# Patient Record
Sex: Male | Born: 1935 | Race: White | Hispanic: No | Marital: Single | Smoking: Former smoker
Health system: Southern US, Community
[De-identification: ages and names within clinical notes are randomized; demographics above are authoritative.]

## PROBLEM LIST (undated history)

## (undated) DIAGNOSIS — I639 Cerebral infarction, unspecified: Secondary | ICD-10-CM

## (undated) DIAGNOSIS — R600 Localized edema: Secondary | ICD-10-CM

## (undated) DIAGNOSIS — F0390 Unspecified dementia without behavioral disturbance: Secondary | ICD-10-CM

## (undated) DIAGNOSIS — R2689 Other abnormalities of gait and mobility: Secondary | ICD-10-CM

## (undated) DIAGNOSIS — I341 Nonrheumatic mitral (valve) prolapse: Secondary | ICD-10-CM

## (undated) DIAGNOSIS — G3183 Dementia with Lewy bodies: Secondary | ICD-10-CM

## (undated) DIAGNOSIS — I6789 Other cerebrovascular disease: Secondary | ICD-10-CM

## (undated) DIAGNOSIS — C4492 Squamous cell carcinoma of skin, unspecified: Secondary | ICD-10-CM

## (undated) DIAGNOSIS — IMO0001 Reserved for inherently not codable concepts without codable children: Secondary | ICD-10-CM

## (undated) DIAGNOSIS — Z7901 Long term (current) use of anticoagulants: Secondary | ICD-10-CM

## (undated) DIAGNOSIS — F419 Anxiety disorder, unspecified: Secondary | ICD-10-CM

## (undated) DIAGNOSIS — F32A Depression, unspecified: Secondary | ICD-10-CM

## (undated) DIAGNOSIS — C4491 Basal cell carcinoma of skin, unspecified: Secondary | ICD-10-CM

## (undated) DIAGNOSIS — I444 Left anterior fascicular block: Secondary | ICD-10-CM

## (undated) DIAGNOSIS — R0600 Dyspnea, unspecified: Secondary | ICD-10-CM

## (undated) DIAGNOSIS — M1990 Unspecified osteoarthritis, unspecified site: Secondary | ICD-10-CM

## (undated) DIAGNOSIS — I77810 Thoracic aortic ectasia: Secondary | ICD-10-CM

## (undated) DIAGNOSIS — Z531 Procedure and treatment not carried out because of patient's decision for reasons of belief and group pressure: Secondary | ICD-10-CM

## (undated) DIAGNOSIS — I7300 Raynaud's syndrome without gangrene: Secondary | ICD-10-CM

## (undated) DIAGNOSIS — D649 Anemia, unspecified: Secondary | ICD-10-CM

## (undated) DIAGNOSIS — F0150 Vascular dementia without behavioral disturbance: Secondary | ICD-10-CM

## (undated) DIAGNOSIS — Z789 Other specified health status: Secondary | ICD-10-CM

## (undated) DIAGNOSIS — N400 Enlarged prostate without lower urinary tract symptoms: Secondary | ICD-10-CM

## (undated) DIAGNOSIS — K4090 Unilateral inguinal hernia, without obstruction or gangrene, not specified as recurrent: Secondary | ICD-10-CM

## (undated) HISTORY — DX: Cerebral infarction, unspecified: I63.9

## (undated) HISTORY — DX: Basal cell carcinoma of skin, unspecified: C44.91

## (undated) HISTORY — PX: COLONOSCOPY: SHX174

## (undated) HISTORY — PX: TONSILLECTOMY: SUR1361

---

## 2008-06-14 ENCOUNTER — Ambulatory Visit: Payer: Self-pay | Admitting: Internal Medicine

## 2009-12-29 IMAGING — CR DG CHEST 2V
1 series · 2 of 2 positions shown · non-contrast
Comparison: none

REASON FOR EXAM: bronchitis
COMMENTS:

PROCEDURE:     DXR - DXR CHEST PA (OR AP) AND LATERAL  - June 14, 2008  [DATE]
RESULT:      The lungs are clear of acute infiltrate. Pleural thickening is
noted in the left lung bases and is most consistent with scarring. The
cardiovascular structures are unremarkable.

[Series 1: view not recorded · 0.17mm/px · 2 of 2 slices shown]
[im 1/2]
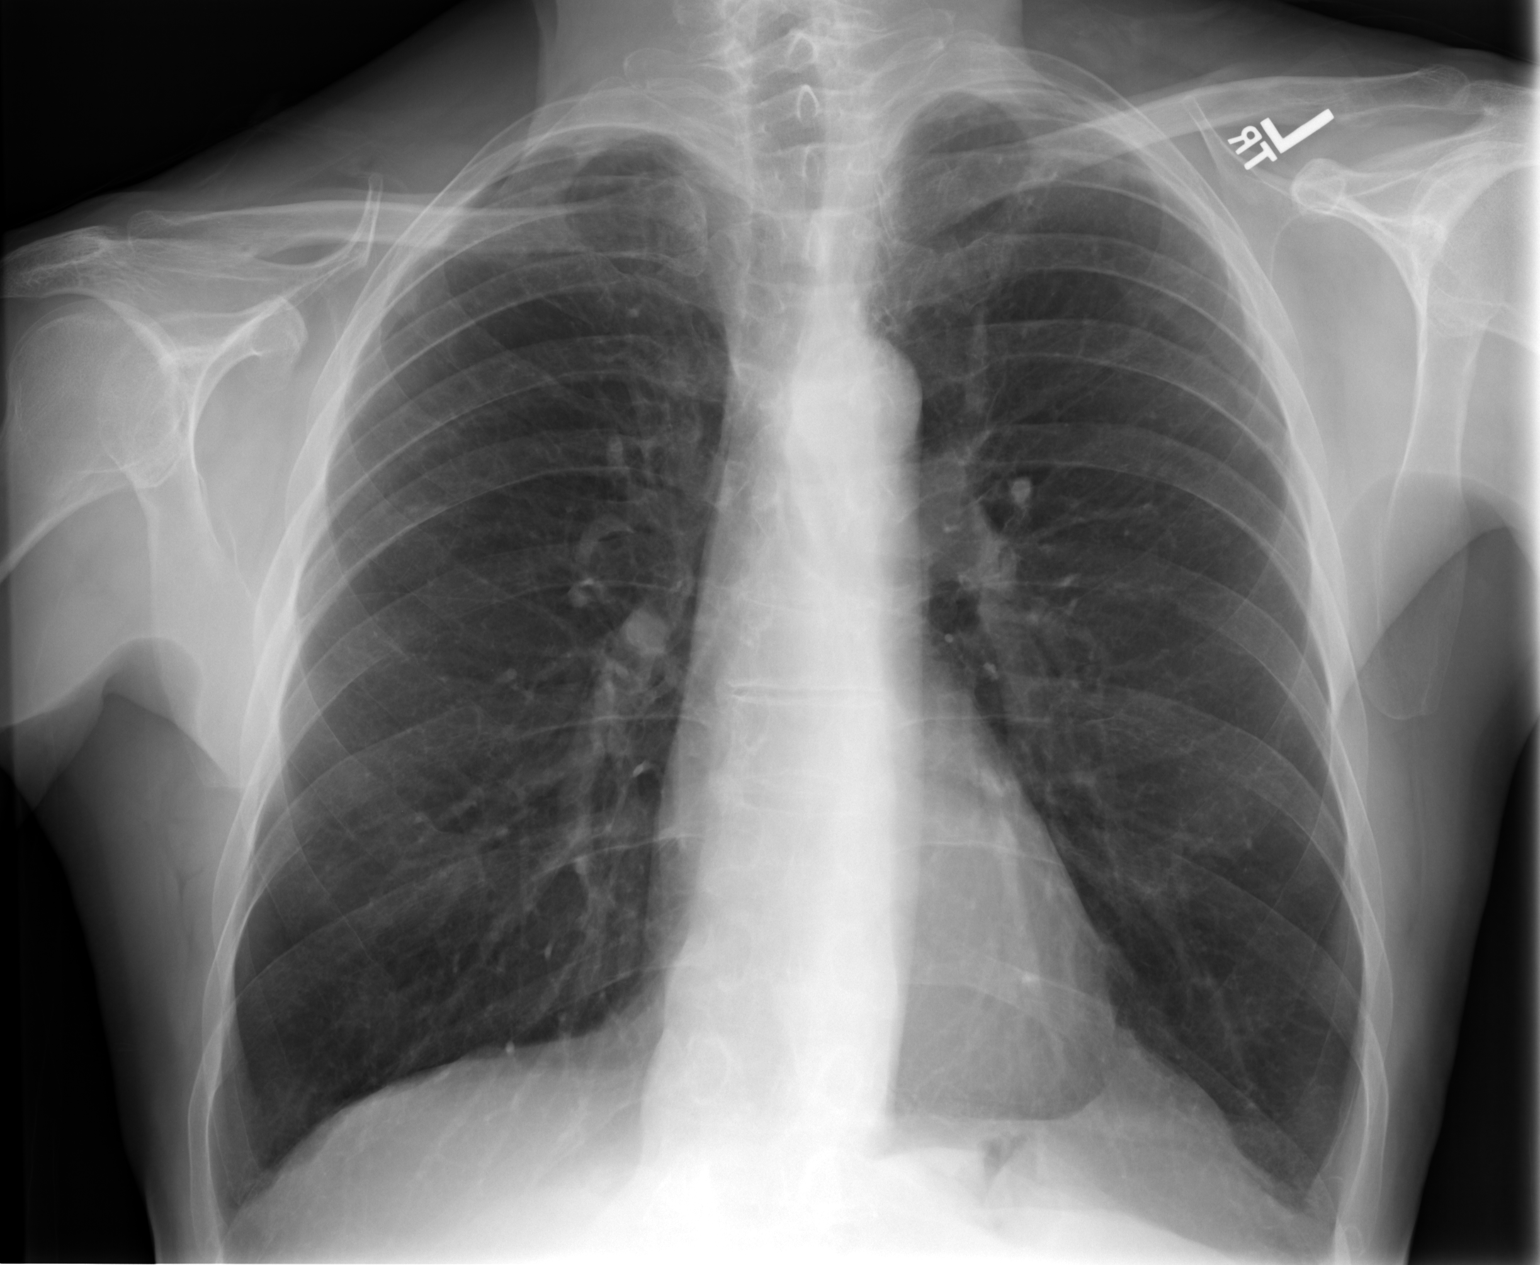
[im 2/2]
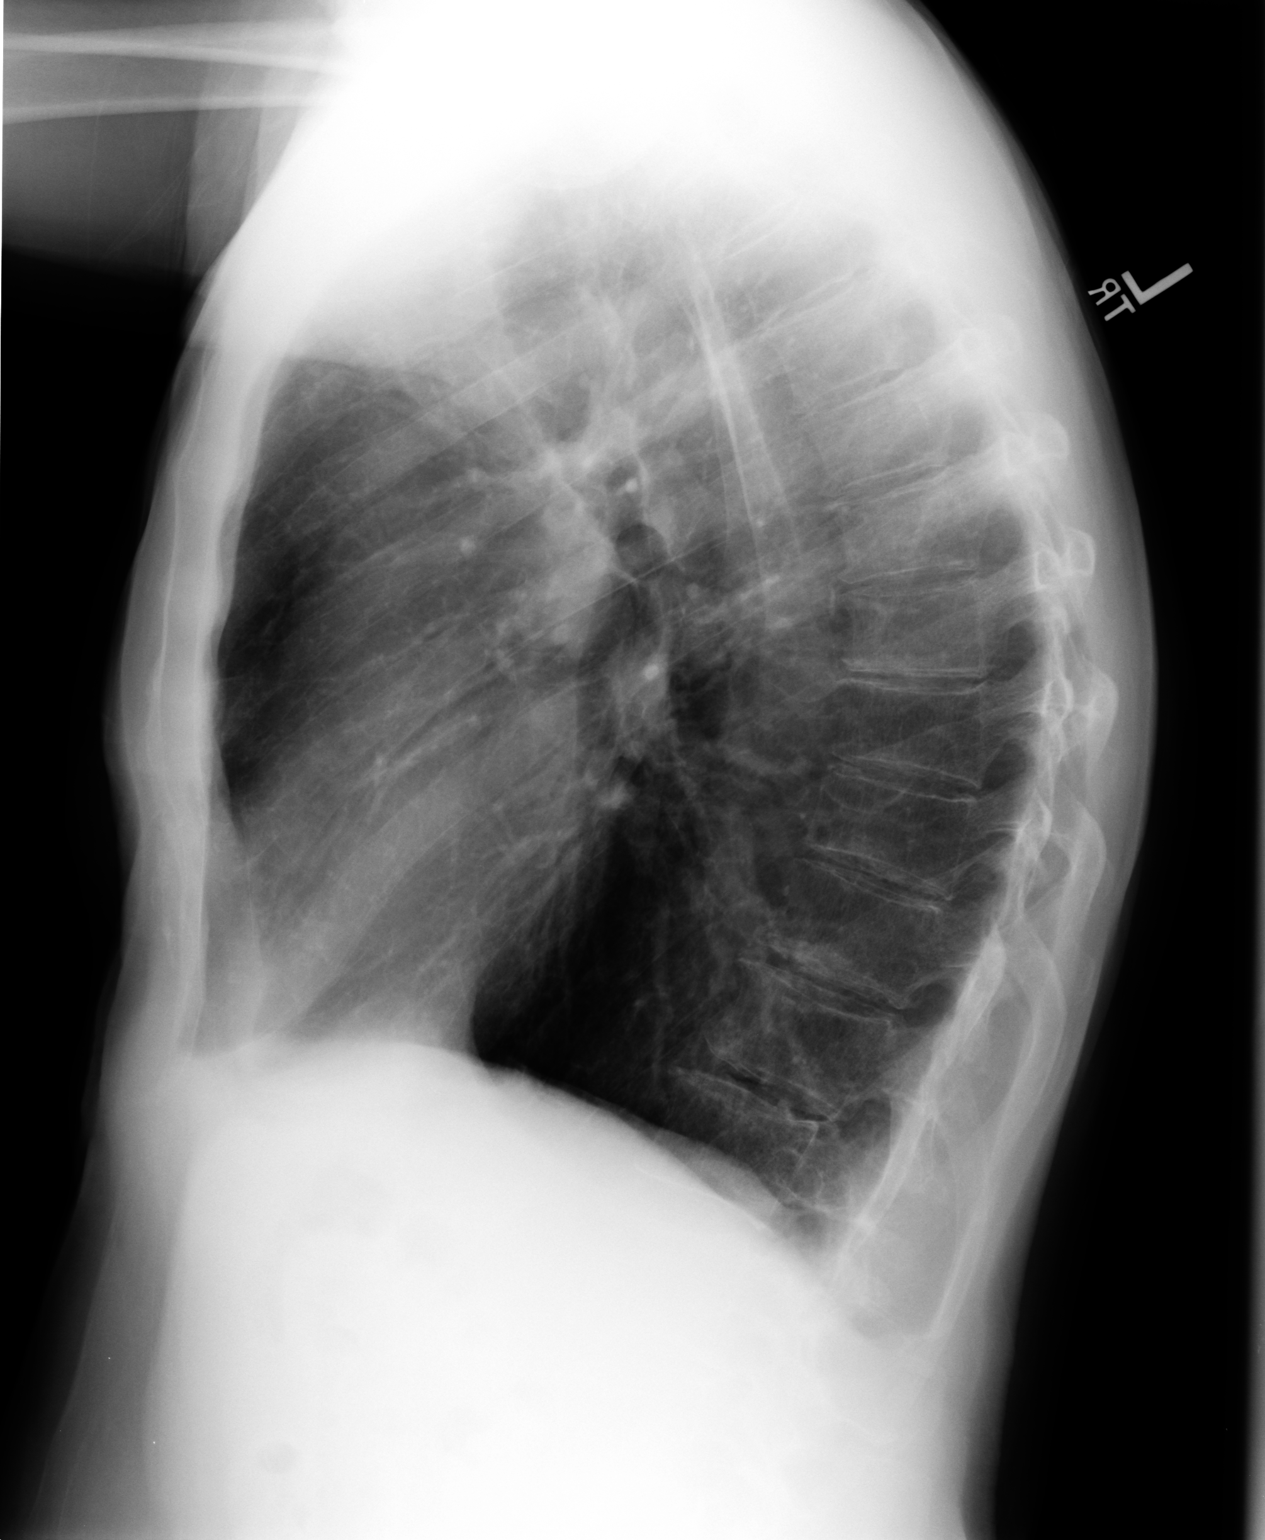

[2 of 2 positions shown; findings below may reference images not displayed]

IMPRESSION: No acute cardiopulmonary disease.

## 2011-07-06 ENCOUNTER — Emergency Department: Payer: Self-pay | Admitting: Emergency Medicine

## 2011-07-06 LAB — CBC WITH DIFFERENTIAL/PLATELET
Basophil #: 0 10*3/uL (ref 0.0–0.1)
HCT: 42.9 % (ref 40.0–52.0)
HGB: 14.8 g/dL (ref 13.0–18.0)
MCHC: 34.5 g/dL (ref 32.0–36.0)
MCV: 100 fL (ref 80–100)
Monocyte #: 0.5 x10 3/mm (ref 0.2–1.0)
Monocyte %: 8.8 %
Neutrophil %: 66.5 %
Platelet: 196 10*3/uL (ref 150–440)
RBC: 4.3 10*6/uL — ABNORMAL LOW (ref 4.40–5.90)
RDW: 12.5 % (ref 11.5–14.5)

## 2018-08-29 ENCOUNTER — Other Ambulatory Visit (HOSPITAL_COMMUNITY): Payer: Self-pay | Admitting: Neurology

## 2018-08-29 ENCOUNTER — Other Ambulatory Visit: Payer: Self-pay | Admitting: Neurology

## 2018-08-29 DIAGNOSIS — R413 Other amnesia: Secondary | ICD-10-CM

## 2018-09-13 ENCOUNTER — Ambulatory Visit (HOSPITAL_COMMUNITY): Payer: Self-pay

## 2018-09-18 ENCOUNTER — Ambulatory Visit (HOSPITAL_COMMUNITY): Payer: Medicare Other

## 2018-10-01 ENCOUNTER — Other Ambulatory Visit: Payer: Self-pay

## 2018-10-01 ENCOUNTER — Ambulatory Visit (HOSPITAL_COMMUNITY)
Admission: RE | Admit: 2018-10-01 | Discharge: 2018-10-01 | Disposition: A | Discharge status: Home / Self Care | Payer: Medicare Other | Source: Ambulatory Visit | Attending: Neurology | Admitting: Neurology

## 2018-10-01 DIAGNOSIS — R413 Other amnesia: Secondary | ICD-10-CM

## 2018-10-01 DIAGNOSIS — I6381 Other cerebral infarction due to occlusion or stenosis of small artery: Secondary | ICD-10-CM

## 2018-10-01 HISTORY — DX: Other cerebral infarction due to occlusion or stenosis of small artery: I63.81

## 2019-02-23 ENCOUNTER — Ambulatory Visit: Payer: Medicare Other | Attending: Internal Medicine

## 2019-02-23 DIAGNOSIS — Z23 Encounter for immunization: Secondary | ICD-10-CM | POA: Insufficient documentation

## 2019-02-23 NOTE — Progress Notes (Signed)
   Covid-19 Vaccination Clinic  Name:  Jacob Salas    MRN: [id number] DOB: [birth year]  02/23/2019  Jacob Salas was observed post Covid-19 immunization for 15 minutes without incidence. He was provided with Vaccine Information Sheet and instruction to access the V-Safe system.   Jacob Salas was instructed to call 911 with any severe reactions post vaccine: Marland Kitchen Difficulty breathing  . Swelling of your face and throat  . A fast heartbeat  . A bad rash all over your body  . Dizziness and weakness

## 2019-03-21 ENCOUNTER — Ambulatory Visit: Payer: Medicare Other | Attending: Internal Medicine

## 2019-03-21 DIAGNOSIS — Z23 Encounter for immunization: Secondary | ICD-10-CM | POA: Insufficient documentation

## 2019-03-21 NOTE — Progress Notes (Signed)
   Covid-19 Vaccination Clinic  Name:  Jacob Salas    MRN: [id number] DOB: August 17, 1932  03/21/2019  [name] was observed post Covid-19 immunization for 15 minutes without incident. He was provided with Vaccine Information Sheet and instruction to access the V-Safe system.   [name] was instructed to call 911 with any severe reactions post vaccine: Marland Kitchen Difficulty breathing  . Swelling of face and throat  . A fast heartbeat  . A bad rash all over body  . Dizziness and weakness   Immunizations Administered    Name Date Dose VIS Date Route   Pfizer COVID-19 Vaccine 03/21/2019  8:56 AM 0.3 mL 12/23/2018 Intramuscular   Manufacturer: [place]   Lot: [id number]   [place]: [id number]

## 2020-01-13 DIAGNOSIS — Z9841 Cataract extraction status, right eye: Secondary | ICD-10-CM

## 2020-01-13 HISTORY — DX: Cataract extraction status, left eye: Z98.41

## 2020-01-24 ENCOUNTER — Other Ambulatory Visit: Payer: Self-pay

## 2020-01-24 ENCOUNTER — Encounter: Payer: Self-pay | Admitting: Ophthalmology

## 2020-01-29 ENCOUNTER — Other Ambulatory Visit: Admission: RE | Admit: 2020-01-29 | Payer: PRIVATE HEALTH INSURANCE | Source: Ambulatory Visit

## 2020-01-30 ENCOUNTER — Other Ambulatory Visit
Admission: RE | Admit: 2020-01-30 | Discharge: 2020-01-30 | Disposition: A | Discharge status: Home / Self Care | Payer: Medicare Other | Source: Ambulatory Visit | Attending: Ophthalmology | Admitting: Ophthalmology

## 2020-01-30 ENCOUNTER — Other Ambulatory Visit: Payer: Self-pay

## 2020-01-30 DIAGNOSIS — Z20822 Contact with and (suspected) exposure to covid-19: Secondary | ICD-10-CM | POA: Diagnosis not present

## 2020-01-30 DIAGNOSIS — Z01818 Encounter for other preprocedural examination: Secondary | ICD-10-CM | POA: Diagnosis present

## 2020-01-30 LAB — SARS CORONAVIRUS 2 (TAT 6-24 HRS): SARS Coronavirus 2: NEGATIVE

## 2020-01-30 NOTE — Discharge Instructions (Signed)

## 2020-01-31 ENCOUNTER — Ambulatory Visit: Payer: Medicare Other | Admitting: Anesthesiology

## 2020-01-31 ENCOUNTER — Encounter: Payer: Self-pay | Admitting: Ophthalmology

## 2020-01-31 ENCOUNTER — Other Ambulatory Visit: Payer: Self-pay

## 2020-01-31 ENCOUNTER — Ambulatory Visit
Admission: RE | Admit: 2020-01-31 | Discharge: 2020-01-31 | Disposition: A | Discharge status: Home / Self Care | Payer: Medicare Other | Attending: Ophthalmology | Admitting: Ophthalmology

## 2020-01-31 ENCOUNTER — Encounter
Admission: RE | Disposition: A | Discharge status: Home / Self Care | Payer: Self-pay | Source: Home / Self Care | Attending: Ophthalmology

## 2020-01-31 DIAGNOSIS — I7300 Raynaud's syndrome without gangrene: Secondary | ICD-10-CM | POA: Insufficient documentation

## 2020-01-31 DIAGNOSIS — H2511 Age-related nuclear cataract, right eye: Secondary | ICD-10-CM | POA: Diagnosis not present

## 2020-01-31 HISTORY — DX: Other specified health status: Z78.9

## 2020-01-31 HISTORY — PX: CATARACT EXTRACTION W/PHACO: SHX586

## 2020-01-31 HISTORY — DX: Unspecified dementia, unspecified severity, without behavioral disturbance, psychotic disturbance, mood disturbance, and anxiety: F03.90

## 2020-01-31 HISTORY — DX: Reserved for inherently not codable concepts without codable children: IMO0001

## 2020-01-31 SURGERY — PHACOEMULSIFICATION, CATARACT, WITH IOL INSERTION
Anesthesia: Monitor Anesthesia Care | Site: Eye | Laterality: Right

## 2020-01-31 MED ORDER — NA HYALUR & NA CHOND-NA HYALUR 0.4-0.35 ML IO KIT
PACK | INTRAOCULAR | Status: DC | PRN
  Administered 2020-01-31: 1 mL via INTRAOCULAR

## 2020-01-31 MED ORDER — ARMC OPHTHALMIC DILATING DROPS
1.0000 "application " | OPHTHALMIC | Status: DC | PRN
  Administered 2020-01-31 (×3): 1 via OPHTHALMIC

## 2020-01-31 MED ORDER — CEFUROXIME OPHTHALMIC INJECTION 1 MG/0.1 ML
INJECTION | OPHTHALMIC | Status: DC | PRN
  Administered 2020-01-31: 0.1 mL via INTRACAMERAL

## 2020-01-31 MED ORDER — BRIMONIDINE TARTRATE-TIMOLOL 0.2-0.5 % OP SOLN
OPHTHALMIC | Status: DC | PRN
  Administered 2020-01-31: 1 [drp] via OPHTHALMIC

## 2020-01-31 MED ORDER — LACTATED RINGERS IV SOLN: INTRAVENOUS | Status: DC

## 2020-01-31 MED ORDER — TETRACAINE HCL 0.5 % OP SOLN
1.0000 [drp] | OPHTHALMIC | Status: DC | PRN
  Administered 2020-01-31 (×3): 1 [drp] via OPHTHALMIC

## 2020-01-31 MED ORDER — LIDOCAINE HCL (PF) 2 % IJ SOLN
INTRAOCULAR | Status: DC | PRN
  Administered 2020-01-31: 1 mL

## 2020-01-31 MED ORDER — FENTANYL CITRATE (PF) 100 MCG/2ML IJ SOLN
INTRAMUSCULAR | Status: DC | PRN
  Administered 2020-01-31 (×2): 50 ug via INTRAVENOUS

## 2020-01-31 MED ORDER — EPINEPHRINE PF 1 MG/ML IJ SOLN
INTRAOCULAR | Status: DC | PRN
  Administered 2020-01-31: 63 mL via OPHTHALMIC

## 2020-01-31 SURGICAL SUPPLY — 23 items
CANNULA ANT/CHMB 27G (MISCELLANEOUS) ×1 IMPLANT
CANNULA ANT/CHMB 27GA (MISCELLANEOUS) ×2 IMPLANT
GLOVE SURG LX 7.5 STRW (GLOVE) ×1
GLOVE SURG LX STRL 7.5 STRW (GLOVE) ×1 IMPLANT
GLOVE SURG TRIUMPH 8.0 PF LTX (GLOVE) ×2 IMPLANT
GOWN STRL REUS W/ TWL LRG LVL3 (GOWN DISPOSABLE) ×2 IMPLANT
GOWN STRL REUS W/TWL LRG LVL3 (GOWN DISPOSABLE) ×4
LENS IOL ACRSF IQ ULTRA 18.5 (Intraocular Lens) IMPLANT
LENS IOL ACRYSOF IQ 18.5 (Intraocular Lens) ×2 IMPLANT
MARKER SKIN DUAL TIP RULER LAB (MISCELLANEOUS) ×2 IMPLANT
NDL CAPSULORHEX 25GA (NEEDLE) ×1 IMPLANT
NDL FILTER BLUNT 18X1 1/2 (NEEDLE) ×2 IMPLANT
NEEDLE CAPSULORHEX 25GA (NEEDLE) ×2 IMPLANT
NEEDLE FILTER BLUNT 18X 1/2SAF (NEEDLE) ×2
NEEDLE FILTER BLUNT 18X1 1/2 (NEEDLE) ×2 IMPLANT
PACK CATARACT BRASINGTON (MISCELLANEOUS) ×2 IMPLANT
PACK EYE AFTER SURG (MISCELLANEOUS) ×2 IMPLANT
PACK OPTHALMIC (MISCELLANEOUS) ×2 IMPLANT
SOLUTION OPHTHALMIC SALT (MISCELLANEOUS) ×2 IMPLANT
SYR 3ML LL SCALE MARK (SYRINGE) ×4 IMPLANT
SYR TB 1ML LUER SLIP (SYRINGE) ×2 IMPLANT
WATER STERILE IRR 250ML POUR (IV SOLUTION) ×2 IMPLANT
WIPE NON LINTING 3.25X3.25 (MISCELLANEOUS) ×2 IMPLANT

## 2020-01-31 NOTE — Anesthesia Postprocedure Evaluation (Signed)
Anesthesia Post Note  Patient: Jacob Salas  Procedure(s) Performed: CATARACT EXTRACTION PHACO AND INTRAOCULAR LENS PLACEMENT (IOC) MALYUGIN RIGHT 10.59 01:12.9 14.5% (Right Eye)     Patient location during evaluation: PACU Anesthesia Type: MAC Level of consciousness: awake and alert Pain management: pain level controlled Vital Signs Assessment: post-procedure vital signs reviewed and stable Respiratory status: spontaneous breathing, nonlabored ventilation, respiratory function stable and patient connected to nasal cannula oxygen Cardiovascular status: stable and blood pressure returned to baseline Postop Assessment: no apparent nausea or vomiting Anesthetic complications: no   No complications documented.  Alisa Graff

## 2020-01-31 NOTE — H&P (Signed)

## 2020-01-31 NOTE — Transfer of Care (Signed)
Immediate Anesthesia Transfer of Care Note  Patient: Jacob Salas  Procedure(s) Performed: CATARACT EXTRACTION PHACO AND INTRAOCULAR LENS PLACEMENT (IOC) MALYUGIN RIGHT 10.59 01:12.9 14.5% (Right Eye)  Patient Location: PACU  Anesthesia Type: MAC  Level of Consciousness: awake, alert  and patient cooperative  Airway and Oxygen Therapy: Patient Spontanous Breathing and Patient connected to supplemental oxygen  Post-op Assessment: Post-op Vital signs reviewed, Patient's Cardiovascular Status Stable, Respiratory Function Stable, Patent Airway and No signs of Nausea or vomiting  Post-op Vital Signs: Reviewed and stable  Complications: No complications documented.

## 2020-01-31 NOTE — Op Note (Signed)
LOCATION:  [facility]   PREOPERATIVE DIAGNOSIS:    Nuclear sclerotic cataract right eye. H25.11   POSTOPERATIVE DIAGNOSIS:  Nuclear sclerotic cataract right eye.     PROCEDURE:  Phacoemusification with posterior chamber intraocular lens placement of the right eye   ULTRASOUND TIME: Procedure(s): CATARACT EXTRACTION PHACO AND INTRAOCULAR LENS PLACEMENT (IOC) MALYUGIN RIGHT 10.59 01:12.9 14.5% (Right)  LENS:   Implant Name Type Inv. Item Serial No. Manufacturer Lot No. LRB No. Used Action  LENS IOL ACRYSOF IQ 18.5 - [id number] Intraocular Lens LENS IOL ACRYSOF IQ 18.5 [id number] ALCON  Right 1 Implanted         SURGEON:  Wyonia Hough, MD   ANESTHESIA:  Topical with tetracaine drops and 2% Xylocaine jelly, augmented with 1% preservative-free intracameral lidocaine.    COMPLICATIONS:  None.   DESCRIPTION OF PROCEDURE:  The patient was identified in the holding room and transported to the operating room and placed in the supine position under the operating microscope.  The right eye was identified as the operative eye and it was prepped and draped in the usual sterile ophthalmic fashion.   A 1 millimeter clear-corneal paracentesis was made at the 12:00 position.  0.5 ml of preservative-free 1% lidocaine was injected into the anterior chamber. The anterior chamber was filled with Viscoat viscoelastic.  A 2.4 millimeter keratome was used to make a near-clear corneal incision at the 9:00 position.  A curvilinear capsulorrhexis was made with a cystotome and capsulorrhexis forceps.  Balanced salt solution was used to hydrodissect and hydrodelineate the nucleus.   Phacoemulsification was then used in stop and chop fashion to remove the lens nucleus and epinucleus.  The remaining cortex was then removed using the irrigation and aspiration handpiece. Provisc was then placed into the capsular bag to distend it for lens placement.  A lens was then injected into the capsular bag.   The remaining viscoelastic was aspirated.   Wounds were hydrated with balanced salt solution.  The anterior chamber was inflated to a physiologic pressure with balanced salt solution.  No wound leaks were noted. Cefuroxime 0.1 ml of a 10mg /ml solution was injected into the anterior chamber for a dose of 1 mg of intracameral antibiotic at the completion of the case.   Timolol and Brimonidine drops were applied to the eye.  The patient was taken to the recovery room in stable condition without complications of anesthesia or surgery.   Hajra Port 01/31/2020, 10:57 AM

## 2020-01-31 NOTE — Anesthesia Procedure Notes (Signed)
Procedure Name: MAC Date/Time: 01/31/2020 10:40 AM Performed by: Jeannene Patella, CRNA Pre-anesthesia Checklist: Patient identified, Emergency Drugs available, Suction available, Timeout performed and Patient being monitored Patient Re-evaluated:Patient Re-evaluated prior to induction Oxygen Delivery Method: Nasal cannula Placement Confirmation: positive ETCO2

## 2020-01-31 NOTE — Anesthesia Preprocedure Evaluation (Signed)
Anesthesia Evaluation  Patient identified by MRN, date of birth, ID band Patient awake    Reviewed: Allergy & Precautions, H&P , NPO status , Patient's Chart, lab work & pertinent test results, reviewed documented beta blocker date and time   Airway Mallampati: II  TM Distance: >3 FB Neck ROM: full    Dental no notable dental hx.    Pulmonary neg pulmonary ROS, former smoker,    Pulmonary exam normal breath sounds clear to auscultation       Cardiovascular Exercise Tolerance: Good negative cardio ROS   Rhythm:regular Rate:Normal     Neuro/Psych Dementia negative neurological ROS  negative psych ROS   GI/Hepatic negative GI ROS, Neg liver ROS,   Endo/Other  negative endocrine ROS  Renal/GU negative Renal ROS  negative genitourinary   Musculoskeletal   Abdominal   Peds  Hematology negative hematology ROS (+) Jehovah's Witness, refuses blood products   Anesthesia Other Findings   Reproductive/Obstetrics negative OB ROS                             Anesthesia Physical Anesthesia Plan  ASA: II  Anesthesia Plan: MAC   Post-op Pain Management:    Induction:   PONV Risk Score and Plan: 1 and Treatment may vary due to age or medical condition  Airway Management Planned:   Additional Equipment:   Intra-op Plan:   Post-operative Plan:   Informed Consent: I have reviewed the patients History and Physical, chart, labs and discussed the procedure including the risks, benefits and alternatives for the proposed anesthesia with the patient or authorized representative who has indicated his/her understanding and acceptance.     Dental Advisory Given  Plan Discussed with: CRNA  Anesthesia Plan Comments:         Anesthesia Quick Evaluation

## 2020-02-01 ENCOUNTER — Encounter: Payer: Self-pay | Admitting: Ophthalmology

## 2020-02-05 ENCOUNTER — Encounter: Payer: Self-pay | Admitting: Ophthalmology

## 2020-02-12 ENCOUNTER — Other Ambulatory Visit
Admission: RE | Admit: 2020-02-12 | Discharge: 2020-02-12 | Disposition: A | Discharge status: Home / Self Care | Payer: Medicare Other | Source: Ambulatory Visit | Attending: Ophthalmology | Admitting: Ophthalmology

## 2020-02-12 ENCOUNTER — Other Ambulatory Visit: Payer: Self-pay

## 2020-02-12 DIAGNOSIS — Z01812 Encounter for preprocedural laboratory examination: Secondary | ICD-10-CM | POA: Insufficient documentation

## 2020-02-12 DIAGNOSIS — Z20822 Contact with and (suspected) exposure to covid-19: Secondary | ICD-10-CM | POA: Diagnosis not present

## 2020-02-12 LAB — SARS CORONAVIRUS 2 (TAT 6-24 HRS): SARS Coronavirus 2: NEGATIVE

## 2020-02-12 NOTE — Discharge Instructions (Signed)

## 2020-02-14 ENCOUNTER — Encounter
Admission: RE | Disposition: A | Discharge status: Home / Self Care | Payer: Self-pay | Source: Home / Self Care | Attending: Ophthalmology

## 2020-02-14 ENCOUNTER — Encounter: Payer: Self-pay | Admitting: Ophthalmology

## 2020-02-14 ENCOUNTER — Ambulatory Visit
Admission: RE | Admit: 2020-02-14 | Discharge: 2020-02-14 | Disposition: A | Discharge status: Home / Self Care | Payer: Medicare Other | Attending: Ophthalmology | Admitting: Ophthalmology

## 2020-02-14 ENCOUNTER — Ambulatory Visit: Payer: Medicare Other | Admitting: Anesthesiology

## 2020-02-14 ENCOUNTER — Other Ambulatory Visit: Payer: Self-pay

## 2020-02-14 DIAGNOSIS — H2512 Age-related nuclear cataract, left eye: Secondary | ICD-10-CM | POA: Insufficient documentation

## 2020-02-14 DIAGNOSIS — Z79899 Other long term (current) drug therapy: Secondary | ICD-10-CM | POA: Insufficient documentation

## 2020-02-14 DIAGNOSIS — Z87891 Personal history of nicotine dependence: Secondary | ICD-10-CM | POA: Diagnosis not present

## 2020-02-14 DIAGNOSIS — Z85828 Personal history of other malignant neoplasm of skin: Secondary | ICD-10-CM | POA: Diagnosis not present

## 2020-02-14 HISTORY — PX: CATARACT EXTRACTION W/PHACO: SHX586

## 2020-02-14 SURGERY — PHACOEMULSIFICATION, CATARACT, WITH IOL INSERTION
Anesthesia: Monitor Anesthesia Care | Site: Eye | Laterality: Left

## 2020-02-14 MED ORDER — LIDOCAINE HCL (PF) 2 % IJ SOLN
INTRAOCULAR | Status: DC | PRN
  Administered 2020-02-14: 1 mL

## 2020-02-14 MED ORDER — BRIMONIDINE TARTRATE-TIMOLOL 0.2-0.5 % OP SOLN
OPHTHALMIC | Status: DC | PRN
  Administered 2020-02-14: 1 [drp] via OPHTHALMIC

## 2020-02-14 MED ORDER — LACTATED RINGERS IV SOLN: INTRAVENOUS | Status: DC

## 2020-02-14 MED ORDER — CEFUROXIME OPHTHALMIC INJECTION 1 MG/0.1 ML
INJECTION | OPHTHALMIC | Status: DC | PRN
  Administered 2020-02-14: 0.1 mL via INTRACAMERAL

## 2020-02-14 MED ORDER — TETRACAINE HCL 0.5 % OP SOLN
1.0000 [drp] | OPHTHALMIC | Status: DC | PRN
  Administered 2020-02-14 (×3): 1 [drp] via OPHTHALMIC

## 2020-02-14 MED ORDER — FENTANYL CITRATE (PF) 100 MCG/2ML IJ SOLN
INTRAMUSCULAR | Status: DC | PRN
  Administered 2020-02-14: 50 ug via INTRAVENOUS

## 2020-02-14 MED ORDER — NA HYALUR & NA CHOND-NA HYALUR 0.4-0.35 ML IO KIT
PACK | INTRAOCULAR | Status: DC | PRN
  Administered 2020-02-14: 1 mL via INTRAOCULAR

## 2020-02-14 MED ORDER — ARMC OPHTHALMIC DILATING DROPS
1.0000 "application " | OPHTHALMIC | Status: DC | PRN
  Administered 2020-02-14 (×3): 1 via OPHTHALMIC

## 2020-02-14 SURGICAL SUPPLY — 23 items
CANNULA ANT/CHMB 27G (MISCELLANEOUS) ×1 IMPLANT
CANNULA ANT/CHMB 27GA (MISCELLANEOUS) ×2 IMPLANT
GLOVE SURG LX 7.5 STRW (GLOVE) ×1
GLOVE SURG LX STRL 7.5 STRW (GLOVE) ×1 IMPLANT
GLOVE SURG TRIUMPH 8.0 PF LTX (GLOVE) ×2 IMPLANT
GOWN STRL REUS W/ TWL LRG LVL3 (GOWN DISPOSABLE) ×2 IMPLANT
GOWN STRL REUS W/TWL LRG LVL3 (GOWN DISPOSABLE) ×4
LENS IOL ACRSF IQ ULTRA 18.0 (Intraocular Lens) IMPLANT
LENS IOL ACRYSOF IQ 18.0 (Intraocular Lens) ×2 IMPLANT
MARKER SKIN DUAL TIP RULER LAB (MISCELLANEOUS) ×2 IMPLANT
NDL CAPSULORHEX 25GA (NEEDLE) ×1 IMPLANT
NDL FILTER BLUNT 18X1 1/2 (NEEDLE) ×2 IMPLANT
NEEDLE CAPSULORHEX 25GA (NEEDLE) ×2 IMPLANT
NEEDLE FILTER BLUNT 18X 1/2SAF (NEEDLE) ×2
NEEDLE FILTER BLUNT 18X1 1/2 (NEEDLE) ×2 IMPLANT
PACK CATARACT BRASINGTON (MISCELLANEOUS) ×2 IMPLANT
PACK EYE AFTER SURG (MISCELLANEOUS) ×2 IMPLANT
PACK OPTHALMIC (MISCELLANEOUS) ×2 IMPLANT
SOLUTION OPHTHALMIC SALT (MISCELLANEOUS) ×2 IMPLANT
SYR 3ML LL SCALE MARK (SYRINGE) ×4 IMPLANT
SYR TB 1ML LUER SLIP (SYRINGE) ×2 IMPLANT
WATER STERILE IRR 250ML POUR (IV SOLUTION) ×2 IMPLANT
WIPE NON LINTING 3.25X3.25 (MISCELLANEOUS) ×2 IMPLANT

## 2020-02-14 NOTE — Transfer of Care (Signed)
Immediate Anesthesia Transfer of Care Note  Patient: Jacob Salas  Procedure(s) Performed: CATARACT EXTRACTION PHACO AND INTRAOCULAR LENS PLACEMENT (IOC) LEFT 7.83 01:04.5 12.1% (Left Eye)  Patient Location: PACU  Anesthesia Type: MAC  Level of Consciousness: awake, alert  and patient cooperative  Airway and Oxygen Therapy: Patient Spontanous Breathing and Patient connected to supplemental oxygen  Post-op Assessment: Post-op Vital signs reviewed, Patient's Cardiovascular Status Stable, Respiratory Function Stable, Patent Airway and No signs of Nausea or vomiting  Post-op Vital Signs: Reviewed and stable  Complications: No complications documented.

## 2020-02-14 NOTE — Anesthesia Postprocedure Evaluation (Signed)
Anesthesia Post Note  Patient: [name]  Procedure(s) Performed: CATARACT EXTRACTION PHACO AND INTRAOCULAR LENS PLACEMENT (IOC) LEFT 7.83 01:04.5 12.1% (Left Eye)     Patient location during evaluation: PACU Anesthesia Type: MAC Level of consciousness: awake and alert Pain management: pain level controlled Vital Signs Assessment: post-procedure vital signs reviewed and stable Respiratory status: spontaneous breathing, nonlabored ventilation, respiratory function stable and patient connected to nasal cannula oxygen Cardiovascular status: stable and blood pressure returned to baseline Postop Assessment: no apparent nausea or vomiting Anesthetic complications: no   No complications documented.  Alisa Graff

## 2020-02-14 NOTE — H&P (Signed)

## 2020-02-14 NOTE — Anesthesia Preprocedure Evaluation (Signed)
Anesthesia Evaluation  Patient identified by MRN, date of birth, ID band Patient awake    Reviewed: Allergy & Precautions, H&P , NPO status , Patient's Chart, lab work & pertinent test results, reviewed documented beta blocker date and time   Airway Mallampati: II  TM Distance: >3 FB Neck ROM: full    Dental no notable dental hx.    Pulmonary neg pulmonary ROS, former smoker,    Pulmonary exam normal breath sounds clear to auscultation       Cardiovascular Exercise Tolerance: Good negative cardio ROS   Rhythm:regular Rate:Normal     Neuro/Psych Dementia negative neurological ROS     GI/Hepatic negative GI ROS, Neg liver ROS,   Endo/Other  negative endocrine ROS  Renal/GU negative Renal ROS  negative genitourinary   Musculoskeletal   Abdominal   Peds  Hematology negative hematology ROS (+)   Anesthesia Other Findings   Reproductive/Obstetrics negative OB ROS                             Anesthesia Physical Anesthesia Plan  ASA: II  Anesthesia Plan: MAC   Post-op Pain Management:    Induction:   PONV Risk Score and Plan: 1 and Treatment may vary due to age or medical condition  Airway Management Planned:   Additional Equipment:   Intra-op Plan:   Post-operative Plan:   Informed Consent: I have reviewed the patients History and Physical, chart, labs and discussed the procedure including the risks, benefits and alternatives for the proposed anesthesia with the patient or authorized representative who has indicated his/her understanding and acceptance.     Dental Advisory Given  Plan Discussed with: CRNA  Anesthesia Plan Comments:         Anesthesia Quick Evaluation

## 2020-02-14 NOTE — Anesthesia Procedure Notes (Signed)
Procedure Name: MAC Date/Time: 02/14/2020 10:46 AM Performed by: Silvana Newness, CRNA Pre-anesthesia Checklist: Patient identified, Emergency Drugs available, Suction available, Patient being monitored and Timeout performed Patient Re-evaluated:Patient Re-evaluated prior to induction Oxygen Delivery Method: Nasal cannula Placement Confirmation: positive ETCO2

## 2020-02-14 NOTE — Op Note (Signed)
OPERATIVE NOTE  Jacob Salas [id number] 02/14/2020   PREOPERATIVE DIAGNOSIS:  Nuclear sclerotic cataract left eye. H25.12   POSTOPERATIVE DIAGNOSIS:    Nuclear sclerotic cataract left eye.     PROCEDURE:  Phacoemusification with posterior chamber intraocular lens placement of the left eye  Ultrasound time: Procedure(s): CATARACT EXTRACTION PHACO AND INTRAOCULAR LENS PLACEMENT (IOC) LEFT 7.83 01:04.5 12.1% (Left)  LENS:   Implant Name Type Inv. Item Serial No. Manufacturer Lot No. LRB No. Used Action  LENS IOL ACRYSOF IQ 18.0 - [id number] Intraocular Lens LENS IOL ACRYSOF IQ 18.0 [id number] ALCON  Left 1 Implanted      SURGEON:  Wyonia Hough, MD   ANESTHESIA:  Topical with tetracaine drops and 2% Xylocaine jelly, augmented with 1% preservative-free intracameral lidocaine.    COMPLICATIONS:  None.   DESCRIPTION OF PROCEDURE:  The patient was identified in the holding room and transported to the operating room and placed in the supine position under the operating microscope.  The left eye was identified as the operative eye and it was prepped and draped in the usual sterile ophthalmic fashion.   A 1 millimeter clear-corneal paracentesis was made at the 1:30 position.  0.5 ml of preservative-free 1% lidocaine was injected into the anterior chamber.  The anterior chamber was filled with Viscoat viscoelastic.  A 2.4 millimeter keratome was used to make a near-clear corneal incision at the 10:30 position.  .  A curvilinear capsulorrhexis was made with a cystotome and capsulorrhexis forceps.  Balanced salt solution was used to hydrodissect and hydrodelineate the nucleus.   Phacoemulsification was then used in stop and chop fashion to remove the lens nucleus and epinucleus.  The remaining cortex was then removed using the irrigation and aspiration handpiece. Provisc was then placed into the capsular bag to distend it for lens placement.  A lens was then injected into the capsular  bag.  The remaining viscoelastic was aspirated.   Wounds were hydrated with balanced salt solution.  The anterior chamber was inflated to a physiologic pressure with balanced salt solution.  No wound leaks were noted. Cefuroxime 0.1 ml of a 10mg /ml solution was injected into the anterior chamber for a dose of 1 mg of intracameral antibiotic at the completion of the case.   Timolol and Brimonidine drops were applied to the eye.  The patient was taken to the recovery room in stable condition without complications of anesthesia or surgery.  Finlay Mills 02/14/2020, 11:01 AM

## 2020-02-15 ENCOUNTER — Encounter: Payer: Self-pay | Admitting: Ophthalmology

## 2020-12-12 DIAGNOSIS — Z8616 Personal history of COVID-19: Secondary | ICD-10-CM

## 2020-12-12 HISTORY — DX: Personal history of COVID-19: Z86.16

## 2021-10-03 ENCOUNTER — Other Ambulatory Visit: Payer: Self-pay | Admitting: Student

## 2021-10-03 DIAGNOSIS — K4090 Unilateral inguinal hernia, without obstruction or gangrene, not specified as recurrent: Secondary | ICD-10-CM

## 2021-10-03 DIAGNOSIS — R1031 Right lower quadrant pain: Secondary | ICD-10-CM

## 2021-10-06 ENCOUNTER — Other Ambulatory Visit: Payer: Medicare Other

## 2021-10-07 ENCOUNTER — Ambulatory Visit
Admission: RE | Admit: 2021-10-07 | Discharge: 2021-10-07 | Disposition: A | Discharge status: Home / Self Care | Payer: Medicare Other | Source: Ambulatory Visit | Attending: Student | Admitting: Student

## 2021-10-07 DIAGNOSIS — K4090 Unilateral inguinal hernia, without obstruction or gangrene, not specified as recurrent: Secondary | ICD-10-CM

## 2021-10-07 DIAGNOSIS — R1031 Right lower quadrant pain: Secondary | ICD-10-CM

## 2021-12-08 ENCOUNTER — Ambulatory Visit: Payer: Self-pay | Admitting: Surgery

## 2021-12-08 NOTE — H&P (Signed)
Subjective:    CC: Non-recurrent unilateral inguinal hernia without obstruction or gangrene [K40.90]   HPI:  Jacob Salas is a 88 y.o. male who was referred by Janifer Adie, MD for evaluation of above. Symptoms were first noted several weeks ago. Pain is dull, intermittent, and discomfort, confined to the right groin, without radiation.  Associated with nothing, exacerbated by exertion  Lump is reducible.    Past Medical History:  has a past medical history of Arthritis, Basal cell carcinoma in situ of skin, Cellulitis of finger of right hand (12/21/2017), Depression, Raynaud's disease without gangrene (06/13/2020), Refusal of blood product (06/02/2015), and Squamous cell carcinoma in situ of skin.   Past Surgical History:       Past Surgical History:  Procedure Laterality Date   ADENOIDECTOMY       MOES       TONSILLECTOMY          Family History: family history includes Alzheimer's disease in his mother; Cancer in his father, maternal grandmother, and paternal grandmother; Colon cancer in his father; Coronary Artery Disease (Blocked arteries around heart) in his father; High blood pressure (Hypertension) in his father and son; Hyperlipidemia (Elevated cholesterol) in his father; Skin cancer in his mother.   Social History:  reports that he has quit smoking. He has never used smokeless tobacco. He reports current alcohol use of about 14.0 standard drinks of alcohol per week. He reports that he does not currently use drugs.   Current Medications: has a current medication list which includes the following prescription(s): carbidopa-levodopa, clotrimazole-betamethasone, donepezil, fluocinonide, multivitamin, omega-3-dha-epa-dpa-fish oil, tumeric-ging-olive-oreg-capryl, and vit c/e/zn/coppr/lutein/zeaxan.   Allergies:     Allergies as of 12/08/2021   (No Known Allergies)      ROS:  A 15 point review of systems was performed and pertinent positives and negatives noted in HPI    Objective:    BP 128/71   Pulse 73   Ht 175.3 cm ('5\' 9"'$ )   Wt 63 kg (139 lb)   BMI 20.53 kg/m    Constitutional :  Alert, cooperative, no distress  Lymphatics/Throat:  Supple, no lymphadenopathy  Respiratory:  clear to auscultation bilaterally  Cardiovascular:  regular rate and rhythm  Gastrointestinal: soft, non-tender; bowel sounds normal; no masses,  no organomegaly. inguinal hernia noted.  moderate, reducible, no overlying skin changes, and RIGHT  Musculoskeletal: Steady gait and movement  Skin: Cool and moist  Psychiatric: Normal affect, non-agitated, not confused         LABS:  N/a    RADS: N/a Assessment:        Non-recurrent unilateral inguinal hernia without obstruction or gangrene [K40.90], RIGHT    Previous workup inconclusive, but very obvious on exam today. Pt anxiety about worsening symptoms.  Wishes to proceed with surgery   Plan:    1. Non-recurrent unilateral inguinal hernia without obstruction or gangrene [K40.90]   Discussed the risk of surgery including recurrence, which can be up to 50% in the case of incisional or complex hernias, possible use of prosthetic materials (mesh) and the increased risk of mesh infxn if used, bleeding, chronic pain, post-op infxn, post-op SBO or ileus, and possible re-operation to address said risks. The risks of general anesthetic, if used, includes MI, CVA, sudden death or even reaction to anesthetic medications also discussed. Alternatives include continued observation.  Benefits include possible symptom relief, prevention of incarceration, strangulation, enlargement in size over time, and the risk of emergency surgery in the face of strangulation.  Typical post-op recovery time of 3-5 days with 2 weeks of activity restrictions were also discussed.   ED return precautions given for sudden increase in pain, size of hernia with accompanying fever, nausea, and/or vomiting.   The patient verbalized understanding and all  questions were answered to the patient's satisfaction.     2. Patient has elected to proceed with surgical treatment. Procedure will be scheduled. RIGHT, robotic assisted laparoscopic   labs/images/medications/previous chart entries reviewed personally and relevant changes/updates noted above.

## 2021-12-12 DIAGNOSIS — I480 Paroxysmal atrial fibrillation: Secondary | ICD-10-CM

## 2021-12-12 DIAGNOSIS — I4891 Unspecified atrial fibrillation: Secondary | ICD-10-CM

## 2021-12-12 HISTORY — DX: Unspecified atrial fibrillation: I48.91

## 2021-12-12 HISTORY — DX: Paroxysmal atrial fibrillation: I48.0

## 2021-12-15 ENCOUNTER — Encounter (HOSPITAL_COMMUNITY): Payer: Self-pay | Admitting: Urgent Care

## 2021-12-15 ENCOUNTER — Ambulatory Visit: Payer: Self-pay | Admitting: Surgery

## 2021-12-15 ENCOUNTER — Encounter
Admission: RE | Admit: 2021-12-15 | Discharge: 2021-12-15 | Disposition: A | Discharge status: Home / Self Care | Payer: Medicare Other | Source: Ambulatory Visit | Attending: Surgery | Admitting: Surgery

## 2021-12-15 ENCOUNTER — Other Ambulatory Visit: Payer: Self-pay

## 2021-12-15 DIAGNOSIS — Z01812 Encounter for preprocedural laboratory examination: Secondary | ICD-10-CM

## 2021-12-15 DIAGNOSIS — I4891 Unspecified atrial fibrillation: Secondary | ICD-10-CM

## 2021-12-15 DIAGNOSIS — Z01818 Encounter for other preprocedural examination: Secondary | ICD-10-CM | POA: Diagnosis present

## 2021-12-15 DIAGNOSIS — R9431 Abnormal electrocardiogram [ECG] [EKG]: Secondary | ICD-10-CM

## 2021-12-15 DIAGNOSIS — Z01810 Encounter for preprocedural cardiovascular examination: Secondary | ICD-10-CM

## 2021-12-15 HISTORY — DX: Depression, unspecified: F32.A

## 2021-12-15 HISTORY — DX: Raynaud's syndrome without gangrene: I73.00

## 2021-12-15 NOTE — H&P (Signed)
Subjective:  CC: Non-recurrent unilateral inguinal hernia without obstruction or gangrene [K40.90]  HPI: Jacob Salas is a 88 y.o. male who was referred by Janifer Adie, MD for evaluation of above. Symptoms were first noted several weeks ago. Pain is dull, intermittent, and discomfort, confined to the right groin, without radiation. Associated with nothing, exacerbated by exertion Lump is reducible.  Past Medical History: has a past medical history of Arthritis, Basal cell carcinoma in situ of skin, Cellulitis of finger of right hand (12/21/2017), Depression, Raynaud's disease without gangrene (06/13/2020), Refusal of blood product (06/02/2015), and Squamous cell carcinoma in situ of skin.  Past Surgical History: Past Surgical History: Procedure Laterality Date ADENOIDECTOMY MOES TONSILLECTOMY  Family History: family history includes Alzheimer's disease in his mother; Cancer in his father, maternal grandmother, and paternal grandmother; Colon cancer in his father; Coronary Artery Disease (Blocked arteries around heart) in his father; High blood pressure (Hypertension) in his father and son; Hyperlipidemia (Elevated cholesterol) in his father; Skin cancer in his mother.  Social History: reports that he has quit smoking. He has never used smokeless tobacco. He reports current alcohol use of about 14.0 standard drinks of alcohol per week. He reports that he does not currently use drugs.  Current Medications: has a current medication list which includes the following prescription(s): carbidopa-levodopa, clotrimazole-betamethasone, donepezil, fluocinonide, multivitamin, omega-3-dha-epa-dpa-fish oil, tumeric-ging-olive-oreg-capryl, and vit c/e/zn/coppr/lutein/zeaxan.  Allergies: Allergies as of 12/08/2021 (No Known Allergies)  ROS: A 15 point review of systems was performed and pertinent positives and negatives noted in HPI  Objective:   BP 128/71  Pulse 73  Ht 175.3 cm ('5\' 9"'$ )   Wt 63 kg (139 lb)  BMI 20.53 kg/m  Constitutional : Alert, cooperative, no distress Lymphatics/Throat: Supple, no lymphadenopathy Respiratory: clear to auscultation bilaterally Cardiovascular: regular rate and rhythm Gastrointestinal: soft, non-tender; bowel sounds normal; no masses, no organomegaly. inguinal hernia noted. moderate, reducible, no overlying skin changes, and RIGHT Musculoskeletal: Steady gait and movement Skin: Cool and moist Psychiatric: Normal affect, non-agitated, not confused   LABS: N/a  RADS: N/a Assessment:   Non-recurrent unilateral inguinal hernia without obstruction or gangrene [K40.90], RIGHT  Previous workup inconclusive, but very obvious on exam today. Pt anxiety about worsening symptoms. Wishes to proceed with surgery  Plan:   1. Non-recurrent unilateral inguinal hernia without obstruction or gangrene [K40.90] Discussed the risk of surgery including recurrence, which can be up to 50% in the case of incisional or complex hernias, possible use of prosthetic materials (mesh) and the increased risk of mesh infxn if used, bleeding, chronic pain, post-op infxn, post-op SBO or ileus, and possible re-operation to address said risks. The risks of general anesthetic, if used, includes MI, CVA, sudden death or even reaction to anesthetic medications also discussed. Alternatives include continued observation. Benefits include possible symptom relief, prevention of incarceration, strangulation, enlargement in size over time, and the risk of emergency surgery in the face of strangulation.  Typical post-op recovery time of 3-5 days with 2 weeks of activity restrictions were also discussed.  ED return precautions given for sudden increase in pain, size of hernia with accompanying fever, nausea, and/or vomiting.  The patient verbalized understanding and all questions were answered to the patient's satisfaction.  2. Patient has elected to proceed with surgical  treatment. Procedure will be scheduled. RIGHT, robotic assisted laparoscopic  labs/images/medications/previous chart entries reviewed personally and relevant changes/updates noted above.

## 2021-12-15 NOTE — Patient Instructions (Addendum)
Your procedure is scheduled on: 12/23/21 - Tuesday Report to the Registration Desk on the 1st floor of the [facility]. To find out your arrival time, please call [phone] between 1PM - 3PM on: 12/22/21 - Monday If your arrival time is 6:00 am, do not arrive prior to that time as the [facility] entrance doors do not open until 6:00 am.  REMEMBER: Instructions that are not followed completely may result in serious medical risk, up to and including death; or upon the discretion of your surgeon and anesthesiologist your surgery may need to be rescheduled.  Do not eat food after midnight the night before surgery.  No gum chewing, lozengers or hard candies.  You may however, drink CLEAR liquids up to 2 hours before you are scheduled to arrive for your surgery. Do not drink anything within 2 hours of your scheduled arrival time.  Clear liquids include: - water  - apple juice without pulp - gatorade (not RED colors) - black coffee or tea (Do NOT add milk or creamers to the coffee or tea) Do NOT drink anything that is not on this list.  TAKE ONLY THESE MEDICATIONS THE MORNING OF SURGERY WITH A SIP OF WATER:  - donepezil (ARICEPT)  - Carbidopa-Levodopa ER    One week prior to surgery: Stop Anti-inflammatories (NSAIDS) such as Advil, Aleve, Ibuprofen, Motrin, Naproxen, Naprosyn and Aspirin based products such as Excedrin, Goodys Powder, BC Powder.  Stop ANY OVER THE COUNTER supplements until after surgery beginning 12/16/21 - Multiple Vitamin , Omega-3 Fatty Acids , Turmeric - ging-olive-oreg-capyl,   You may take Tylenol if needed for pain up until the day of surgery.  No Alcohol for 24 hours before or after surgery.  No Smoking including e-cigarettes for 24 hours prior to surgery.  No chewable tobacco products for at least 6 hours prior to surgery.  No nicotine patches on the day of surgery.  Do not use any "recreational" drugs for at least a week prior to your surgery.   Please be advised that the combination of cocaine and anesthesia may have negative outcomes, up to and including death. If you test positive for cocaine, your surgery will be cancelled.  On the morning of surgery brush your teeth with toothpaste and water, you may rinse your mouth with mouthwash if you wish. Do not swallow any toothpaste or mouthwash.  Use CHG Soap or wipes as directed on instruction sheet.  Do not wear jewelry, make-up, hairpins, clips or nail polish.  Do not wear lotions, powders, or perfumes.   Do not shave body from the neck down 48 hours prior to surgery just in case you cut yourself which could leave a site for infection. Also, freshly shaved skin may become irritated if using the CHG soap.  Contact lenses, hearing aids and dentures may not be worn into surgery.  Do not bring valuables to the hospital. [facility] is not responsible for any missing/lost belongings or valuables.   Notify your doctor if there is any change in your medical condition (cold, fever, infection).  Wear comfortable clothing (specific to your surgery type) to the hospital.  After surgery, you can help prevent lung complications by doing breathing exercises.  Take deep breaths and cough every 1-2 hours. Your doctor may order a device called an Incentive Spirometer to help you take deep breaths. When coughing or sneezing, hold a pillow firmly against your incision with both hands. This is called "splinting." Doing this helps protect your incision. It  also decreases belly discomfort.  If you are being admitted to the hospital overnight, leave your suitcase in the car. After surgery it may be brought to your room.  If you are being discharged the day of surgery, you will not be allowed to drive home. You will need a responsible adult (18 years or older) to drive you home and stay with you that night.   If you are taking public transportation, you will need to have a responsible adult (18  years or older) with you. Please confirm with your physician that it is acceptable to use public transportation.   Please call the [facility] Dept. at [phone] if you have any questions about these instructions.  Surgery Visitation Policy:  Patients undergoing a surgery or procedure may have two family members or support persons with them as long as the person is not COVID-19 positive or experiencing its symptoms.   Inpatient Visitation:    Visiting hours are 7 a.m. to 8 p.m. Up to four visitors are allowed at one time in a patient room. The visitors may rotate out with other people during the day. One designated support person (adult) may remain overnight.  MASKING: Due to an increase in RSV rates and hospitalizations, in-patient care areas in which we serve newborns, infants and children, masks will be required for teammates and visitors.  Children ages 57 and under may not visit. This policy affects the following departments only:  [facility] area Mother Baby Unit Newborn nursery/Special care nursery  Other areas: Masks continue to be strongly recommended for [facility] teammates, visitors and patients in all other areas. Visitation is not restricted outside of the units listed above.

## 2021-12-15 NOTE — Progress Notes (Addendum)
  Perioperative Services Pre-Admission/Anesthesia Testing    Date: 12/15/21  Name: Jacob Salas MRN:   [id number]  Re: New onset atrial fibrillation and need for preoperative evaluation/clearance  Planned Surgical Procedure(s):    Case: [id number] Date/Time: 12/23/21 1230   Procedure: XI ROBOTIC ASSISTED INGUINAL HERNIA (Right: Groin)   Anesthesia type: General   Pre-op diagnosis: Non-recurrent unilateral inguinal hernia without obstruction or gangrene K40.90   Location: [facility] OR ROOM 04 / [facility] FOR ANESTHESIA GROUP   Surgeons: Benjamine Sprague, DO   Clinical Notes:  Patient is scheduled for the above procedure on 12/23/2021 with Dr. Benjamine Sprague, DO.  In preparation for his procedure, patient presented to the PAT clinic on the morning of 12/15/2021 for preoperative ECG.  In review of tracing, patient can new onset atrial fibrillation at a controlled rate of 65 bpm.  Patient asymptomatic with no complaints of chest pain, shortness of breath, or vertiginous symptoms.    EMR reviewed, there is no mention of previous atrial fibrillation noted in his history.  Patient with labs as follows: TSH on 06/17/2021 normal at 3.68 IU/mL.  Hemoglobin 13.2 g/dL and hematocrit 39.3% on 10/02/2021 Chemistries normal with the exception of a mildly depressed sodium level (134 mmol/L) on 10/02/2021.  EKG:    Impression and Plan:  [name] found to be and presumably acute onset atrial fibrillation on preoperative ECG.  Patient asymptomatic.  Most recent labs normal.  Patient has no significant cardiovascular diagnoses per his report, therefore he has never been seen in consult by cardiology. He expresses that he would like to be seen by [facility].  Will send referral documents and copy of note today. Patient has a copy of the ECG that was obtained today to provide to cardiologist.  As previously mentioned, patient is scheduled for surgery on 12/23/2021.  No changes are being made to the OR  schedule at this time.  Sending copy of note to primary attending surgeon Lysle Pearl, DO) to make him aware.  Patient advised that every effort would be made by cardiology to get him seen prior to scheduled elective general surgery procedure, however based on availability and any testing deemed necessary, procedure may need to be rescheduled.  Will plan on following up with cardiology regarding disposition and ultimate clearance as information becomes available.  Orders Placed This Encounter  Procedures  Ambulatory referral to Cardiology   Referral Priority:   Routine   Referral Type:   Consultation   Referral Reason:   Specialty Services Required   Requested Specialty:   Cardiology   Number of Visits Requested:   1       Reason: Referring to [facility] per patient preference. Patient will be  new to cardiology service. Needs new patient appointment for evaluation on new onset atrial         fibrillation and preoperative clearance. Patient scheduled for hernia surgery with Dr. Lysle Pearl, DO on 12/23/2021. Please contact patient with appointment time and date. Also, please let us know when patient can be seen so that I can make sure that surgical/anesthetic teams are all aware.   Honor Loh, MSN, APRN, FNP-C, CEN [facility]  Peri-operative Services Nurse Practitioner Phone: [phone] 12/15/21 11:18 AM  NOTE: This note has been prepared using Dragon dictation software. Despite my best ability to proofread, there is always the potential that unintentional transcriptional errors may still occur from this process.

## 2021-12-16 ENCOUNTER — Encounter: Payer: Self-pay | Admitting: Surgery

## 2021-12-18 ENCOUNTER — Telehealth: Payer: Self-pay | Admitting: Urgent Care

## 2021-12-18 NOTE — Progress Notes (Addendum)
  Perioperative Services Pre-Admission/Anesthesia Testing    Date: 12/18/21  Name: Jacob Salas MRN:   [id number]  Re: New onset atrial fibrillation and need for cardiovascular clearance  Patient is scheduled to undergo a ROBOTIC ASSISTED RIGHT INGUINAL HERNIA REPAIR on 12/23/2021 with Benjamine Sprague, DO.  In preparation for his procedure, patient presented to the PAT clinic on 12/15/2021 for preoperative testing.  Preoperative ECG revealed new onset atrial fibrillation.  Patient was referred to cardiology.  He was seen in consult on 12/18/2021; note pending dictation.  From what is visible and the care everywhere encounter from today, I do see where providers have ordered a long-term cardiac event monitor study (Holter) and transthoracic echocardiogram to further evaluate patient's new onset atrial fibrillation.  Received faxed communication from cardiology office at 1700 PM on 12/18/2021 that indicated that patient was seen today and that "patient will have to undergo cardiac testing before being cleared for his upcoming scheduled procedure". At this current time, I am unsure of the timeframe related to the aforementioned testing.  Will send a copy of this note to primary attending surgeon Lysle Pearl, DO) in efforts to make him aware of need for further evaluation and testing prior to proceeding with elective hernia repair.  Honor Loh, MSN, APRN, FNP-C, CEN [facility]  Peri-operative Services Nurse Practitioner Phone: [phone] 12/18/21 5:07 PM  NOTE: This note has been prepared using Dragon dictation software. Despite my best ability to proofread, there is always the potential that unintentional transcriptional errors may still occur from this process.

## 2021-12-23 ENCOUNTER — Encounter: Admission: RE | Payer: Self-pay | Source: Home / Self Care

## 2021-12-23 ENCOUNTER — Ambulatory Visit: Admission: RE | Admit: 2021-12-23 | Payer: Medicare Other | Source: Home / Self Care | Admitting: Surgery

## 2021-12-23 HISTORY — DX: Unspecified osteoarthritis, unspecified site: M19.90

## 2021-12-23 HISTORY — DX: Basal cell carcinoma of skin, unspecified: C44.91

## 2021-12-23 HISTORY — DX: Other abnormalities of gait and mobility: R26.89

## 2021-12-23 HISTORY — DX: Anxiety disorder, unspecified: F41.9

## 2021-12-23 HISTORY — DX: Anemia, unspecified: D64.9

## 2021-12-23 HISTORY — DX: Unilateral inguinal hernia, without obstruction or gangrene, not specified as recurrent: K40.90

## 2021-12-23 HISTORY — DX: Reserved for inherently not codable concepts without codable children: IMO0001

## 2021-12-23 HISTORY — DX: Vascular dementia, unspecified severity, without behavioral disturbance, psychotic disturbance, mood disturbance, and anxiety: F01.50

## 2021-12-23 HISTORY — DX: Procedure and treatment not carried out because of patient's decision for reasons of belief and group pressure: Z53.1

## 2021-12-23 HISTORY — DX: Squamous cell carcinoma of skin, unspecified: C44.92

## 2021-12-23 SURGERY — HERNIORRHAPHY, INGUINAL, ROBOT-ASSISTED, LAPAROSCOPIC
Anesthesia: General | Site: Groin | Laterality: Right

## 2022-02-09 ENCOUNTER — Ambulatory Visit: Payer: Self-pay | Admitting: Surgery

## 2022-02-09 NOTE — H&P (Signed)
Subjective:    CC: Non-recurrent unilateral inguinal hernia without obstruction or gangrene [K40.90]   HPI:  Jacob Salas is a 89 y.o. male who was referred by Self for evaluation of above. Symptoms were first noted several weeks ago. Pain is dull, intermittent, and discomfort, confined to the right groin, without radiation.  Associated with nothing, exacerbated by exertion  Lump is reducible.    Returns today for additional questions.   Past Medical History:  has a past medical history of Arthritis, Basal cell carcinoma in situ of skin, Cellulitis of finger of right hand (12/21/2017), Depression, Raynaud's disease without gangrene (06/13/2020), Refusal of blood product (06/02/2015), and Squamous cell carcinoma in situ of skin.   Past Surgical History:       Past Surgical History:  Procedure Laterality Date   ADENOIDECTOMY       MOES       TONSILLECTOMY          Family History: family history includes Alzheimer's disease in his mother; Cancer in his father, maternal grandmother, and paternal grandmother; Colon cancer in his father; Coronary Artery Disease (Blocked arteries around heart) in his father; High blood pressure (Hypertension) in his father and son; Hyperlipidemia (Elevated cholesterol) in his father; Skin cancer in his mother.   Social History:  reports that he has quit smoking. He has never used smokeless tobacco. He reports current alcohol use of about 14.0 standard drinks of alcohol per week. He reports that he does not currently use drugs.   Current Medications: has a current medication list which includes the following prescription(s): apixaban, carbidopa-levodopa, clotrimazole-betamethasone, donepezil, fluocinonide, metoprolol tartrate, multivitamin, omega-3-dha-epa-dpa-fish oil, turmeric-ging-olive-oreg-capry, and vit c/e/zn/coppr/lutein/zeaxan.   Allergies:     Allergies as of 02/09/2022   (No Known Allergies)      ROS:  A 15 point review of systems was  performed and pertinent positives and negatives noted in HPI   Objective:    BP 127/70   Pulse 79   Ht 175.3 cm ('5\' 9"'$ )   Wt 64.9 kg (143 lb)   BMI 21.12 kg/m    Constitutional :  Alert, cooperative, no distress  Lymphatics/Throat:  Supple, no lymphadenopathy  Respiratory:  clear to auscultation bilaterally  Cardiovascular:  regular rate and rhythm  Gastrointestinal: soft, non-tender; bowel sounds normal; no masses,  no organomegaly. inguinal hernia noted.  moderate, reducible, no overlying skin changes, and RIGHT  Musculoskeletal: Steady gait and movement  Skin: Cool and moist  Psychiatric: Normal affect, non-agitated, not confused         LABS:  N/a    RADS: N/a Assessment:        Non-recurrent unilateral inguinal hernia without obstruction or gangrene [K40.90], RIGHT    Previous workup inconclusive, but very obvious on exam today. Pt anxiety about worsening symptoms.  Still Wishes to proceed with surgery   Plan:    1. Non-recurrent unilateral inguinal hernia without obstruction or gangrene [K40.90]   Discussed the risk of surgery including recurrence, which can be up to 50% in the case of incisional or complex hernias, possible use of prosthetic materials (mesh) and the increased risk of mesh infxn if used, bleeding, chronic pain, post-op infxn, post-op SBO or ileus, and possible re-operation to address said risks. The risks of general anesthetic, if used, includes MI, CVA, sudden death or even reaction to anesthetic medications also discussed. Alternatives include continued observation.  Benefits include possible symptom relief, prevention of incarceration, strangulation, enlargement in size over time, and the  risk of emergency surgery in the face of strangulation.    Typical post-op recovery time of 3-5 days with 2 weeks of activity restrictions were also discussed.   ED return precautions given for sudden increase in pain, size of hernia with accompanying fever, nausea,  and/or vomiting.   The patient verbalized understanding and all questions were answered to the patient's satisfaction.     2. Patient has elected to proceed with surgical treatment. Procedure will be scheduled. RIGHT, robotic assisted laparoscopic.  WILL ALSO FIX LEFT IF INCIDENTALLY NOTED PER PT REQUEST   Answered all questions today regarding anicoag management, robotic assisted approach and type of mesh used.  Resources provided as well for online reference   labs/images/medications/previous chart entries reviewed personally and relevant changes/updates noted above.

## 2022-02-17 ENCOUNTER — Encounter
Admission: RE | Admit: 2022-02-17 | Discharge: 2022-02-17 | Disposition: A | Discharge status: Home / Self Care | Payer: Medicare Other | Source: Ambulatory Visit | Attending: Surgery | Admitting: Surgery

## 2022-02-17 DIAGNOSIS — I4891 Unspecified atrial fibrillation: Secondary | ICD-10-CM

## 2022-02-17 DIAGNOSIS — Z01812 Encounter for preprocedural laboratory examination: Secondary | ICD-10-CM

## 2022-02-17 NOTE — Pre-Procedure Instructions (Signed)
Follow up completed for PAT, patient allergies, medications and HX reviewed and updated, patient will have labs done on 02/18/22, and get an updated copy of surgery instructions which will include stop date and restart date for Eliquis per Dr. Clayborn Bigness - Cardiology.

## 2022-02-17 NOTE — Patient Instructions (Addendum)
Your procedure is scheduled on: 02/26/22 - Thursday Report to the Registration Desk on the 1st floor of the [facility]. To find out your arrival time, please call [phone] between 1PM - 3PM on: 02/25/22 - Wednesday If your arrival time is 6:00 am, do not arrive before that time as the [facility] entrance doors do not open until 6:00 am.  REMEMBER: Instructions that are not followed completely may result in serious medical risk, up to and including death; or upon the discretion of your surgeon and anesthesiologist your surgery may need to be rescheduled.  Do not eat food after midnight the night before surgery.  No gum chewing or hard candies.  You may however, drink CLEAR liquids up to 2 hours before you are scheduled to arrive for your surgery. Do not drink anything within 2 hours of your scheduled arrival time.  Clear liquids include: - water  - apple juice without pulp - gatorade (not RED colors) - black coffee or tea (Do NOT add milk or creamers to the coffee or tea) Do NOT drink anything that is not on this list.  One week prior to surgery: Stop Anti-inflammatories (NSAIDS) such as Advil, Aleve, Ibuprofen, Motrin, Naproxen, Naprosyn and Aspirin based products such as Excedrin, Goody's Powder, BC Powder.  Stop ANY OVER THE COUNTER supplements until after surgery.  You may however, continue to take Tylenol if needed for pain up until the day of surgery.   TAKE ONLY  THESE MEDICATIONS THE MORNING OF SURGERY WITH A SIP OF WATER: - Carbidopa-Levodopa ER  - donepezil (ARICEPT)  - metoprolol tartrate   HOLD ELIQUIS beginning 02/23/22, may restart 02/28/22.  No Alcohol for 24 hours before or after surgery.  No Smoking including e-cigarettes for 24 hours before surgery.  No chewable tobacco products for at least 6 hours before surgery.  No nicotine patches on the day of surgery.  Do not use any "recreational" drugs for at least a week (preferably 2 weeks) before your  surgery.  Please be advised that the combination of cocaine and anesthesia may have negative outcomes, up to and including death. If you test positive for cocaine, your surgery will be cancelled.  On the morning of surgery brush your teeth with toothpaste and water, you may rinse your mouth with mouthwash if you wish. Do not swallow any toothpaste or mouthwash.  Use CHG Soap or wipes as directed on instruction sheet.  Do not wear jewelry, make-up, hairpins, clips or nail polish.  Do not wear lotions, powders, or perfumes.   Do not shave body hair from the neck down 48 hours before surgery.  Contact lenses, hearing aids and dentures may not be worn into surgery.  Do not bring valuables to the hospital. [facility] is not responsible for any missing/lost belongings or valuables.   Notify your doctor if there is any change in your medical condition (cold, fever, infection).  Wear comfortable clothing (specific to your surgery type) to the hospital.  After surgery, you can help prevent lung complications by doing breathing exercises.  Take deep breaths and cough every 1-2 hours. Your doctor may order a device called an Incentive Spirometer to help you take deep breaths. When coughing or sneezing, hold a pillow firmly against your incision with both hands. This is called "splinting." Doing this helps protect your incision. It also decreases belly discomfort.  If you are being admitted to the hospital overnight, leave your suitcase in the car. After surgery it may be brought  to your room.  In case of increased patient census, it may be necessary for you, the patient, to continue your postoperative care in the Same Day Surgery department.  If you are being discharged the day of surgery, you will not be allowed to drive home. You will need a responsible individual to drive you home and stay with you for 24 hours after surgery.   If you are taking public transportation, you will need to have  a responsible individual with you.  Please call the [facility] Dept. at [phone] if you have any questions about these instructions.  Surgery Visitation Policy:  Patients undergoing a surgery or procedure may have two family members or support persons with them as long as the person is not COVID-19 positive or experiencing its symptoms.   Inpatient Visitation:    Visiting hours are 7 a.m. to 8 p.m. Up to four visitors are allowed at one time in a patient room. The visitors may rotate out with other people during the day. One designated support person (adult) may remain overnight.  Due to an increase in RSV and influenza rates and associated hospitalizations, children ages 51 and under will not be able to visit patients in [facility]. Masks continue to be strongly recommended.

## 2022-02-18 ENCOUNTER — Encounter
Admission: RE | Admit: 2022-02-18 | Discharge: 2022-02-18 | Disposition: A | Discharge status: Home / Self Care | Payer: Medicare Other | Source: Ambulatory Visit | Attending: Surgery | Admitting: Surgery

## 2022-02-18 DIAGNOSIS — Z01812 Encounter for preprocedural laboratory examination: Secondary | ICD-10-CM | POA: Insufficient documentation

## 2022-02-18 DIAGNOSIS — I4891 Unspecified atrial fibrillation: Secondary | ICD-10-CM | POA: Diagnosis not present

## 2022-02-18 LAB — BASIC METABOLIC PANEL
Anion gap: 7 (ref 5–15)
BUN: 17 mg/dL (ref 8–23)
CO2: 28 mmol/L (ref 22–32)
Calcium: 9.4 mg/dL (ref 8.9–10.3)
Chloride: 104 mmol/L (ref 98–111)
Creatinine, Ser: 0.82 mg/dL (ref 0.61–1.24)
GFR, Estimated: >60 mL/min (ref >60)
Glucose, Bld: 93 mg/dL (ref 70–99)
Potassium: 4.7 mmol/L (ref 3.5–5.1)
Sodium: 139 mmol/L (ref 135–145)

## 2022-02-18 LAB — CBC
HCT: 40.8 % (ref 39.0–52.0)
Hemoglobin: 13.7 g/dL (ref 13.0–17.0)
MCH: 32.9 pg (ref 26.0–34.0)
MCHC: 33.6 g/dL (ref 30.0–36.0)
MCV: 98.1 fL (ref 80.0–100.0)
Platelets: 209 10*3/uL (ref 150–400)
RBC: 4.16 MIL/uL — ABNORMAL LOW (ref 4.22–5.81)
RDW: 12.3 % (ref 11.5–15.5)
WBC: 7.7 10*3/uL (ref 4.0–10.5)
nRBC: 0 % (ref 0.0–0.2)

## 2022-02-26 ENCOUNTER — Ambulatory Visit: Admit: 2022-02-26 | Payer: Medicare Other | Admitting: Surgery

## 2022-02-26 SURGERY — HERNIORRHAPHY, INGUINAL, ROBOT-ASSISTED, LAPAROSCOPIC
Anesthesia: General | Site: Groin | Laterality: Right

## 2022-06-05 ENCOUNTER — Ambulatory Visit (INDEPENDENT_AMBULATORY_CARE_PROVIDER_SITE_OTHER): Payer: Medicare Other | Admitting: Urology

## 2022-06-05 ENCOUNTER — Encounter: Payer: Self-pay | Admitting: Urology

## 2022-06-05 VITALS — BP 126/79 | Ht 69.0 in | Wt 143.0 lb

## 2022-06-05 DIAGNOSIS — N401 Enlarged prostate with lower urinary tract symptoms: Secondary | ICD-10-CM | POA: Diagnosis not present

## 2022-06-05 DIAGNOSIS — R319 Hematuria, unspecified: Secondary | ICD-10-CM

## 2022-06-05 DIAGNOSIS — R310 Gross hematuria: Secondary | ICD-10-CM

## 2022-06-05 LAB — URINALYSIS, COMPLETE
Bilirubin, UA: NEGATIVE
Glucose, UA: NEGATIVE
Ketones, UA: NEGATIVE
Leukocytes,UA: NEGATIVE
Nitrite, UA: NEGATIVE
Protein,UA: NEGATIVE
RBC, UA: NEGATIVE
Specific Gravity, UA: 1.01 (ref 1.005–1.030)
Urobilinogen, Ur: 0.2 mg/dL (ref 0.2–1.0)
pH, UA: 5.5 (ref 5.0–7.5)

## 2022-06-05 LAB — MICROSCOPIC EXAMINATION

## 2022-06-05 LAB — BLADDER SCAN AMB NON-IMAGING: Scan Result: 69

## 2022-06-05 NOTE — Progress Notes (Signed)
Jacob Salas,acting as a scribe for Jacob Altes, MD.,have documented all relevant documentation on the behalf of Jacob Altes, MD,as directed by  Jacob Altes, MD while in the presence of Jacob Altes, MD.  06/05/2022 8:53 AM   Jacob Salas November 22, 1932 [id number]  Referring provider: Orene Desanctis, MD [address] [place],  [place] [address]  Chief Complaint  Patient presents with   New Patient (Initial Visit)   Urinary Tract Infection    HPI: Jacob Salas is a 89 y.o. male who is a referral for evaluation of UTI and hematuria.  Seen at [facility] ED on 05/14/2022 with gross hematuria Had been treated with a 10 day course of Septra DS for a UTI 3 weeks prior to that visit UA in the ED was turbid with >182 RBC/182 WBC and urine culture was positive for E. Coli Previous urine culture 04/23/2022 was also positive for E. Coli He was treated with a 10 day course of Keflex   PMH: Past Medical History:  Diagnosis Date   A-fib (HCC) 12/2021   Anemia    Anxiety    Basal cell carcinoma of skin    Depression    H/O bilateral cataract extraction 2022   History of 2019 novel coronavirus disease (COVID-19) 12/12/2020   Mixed Alzheimer's and vascular dementia (HCC)    a.) on donepezil   Osteoarthritis    Raynaud's disease without gangrene    Refusal of blood transfusions as patient is Jehovah's Witness    Right inguinal hernia    Right thalamic stroke (HCC) 10/01/2018   a.) MRI brain 10/01/2018 : cerebral white matter signal changes with solitary chronic microhemorrhage in the right thalamus; followed by neurology as an outpatient   Shuffling gait    a.) taking carbidopa/levodopa   Squamous cell skin cancer     Surgical History: Past Surgical History:  Procedure Laterality Date   CATARACT EXTRACTION W/PHACO Right 01/31/2020   Procedure: CATARACT EXTRACTION PHACO AND INTRAOCULAR LENS PLACEMENT (IOC) MALYUGIN RIGHT 10.59 01:12.9 14.5%;  Surgeon: Lockie Mola,  MD;  Location: [facility];  Service: Ophthalmology;  Laterality: Right;   CATARACT EXTRACTION W/PHACO Left 02/14/2020   Procedure: CATARACT EXTRACTION PHACO AND INTRAOCULAR LENS PLACEMENT (IOC) LEFT 7.83 01:04.5 12.1%;  Surgeon: Lockie Mola, MD;  Location: [facility];  Service: Ophthalmology;  Laterality: Left;   COLONOSCOPY     TONSILLECTOMY      Home Medications:  Allergies as of 06/05/2022   No Known Allergies      Medication List        Accurate as of Jun 05, 2022  8:53 AM. If you have any questions, ask your nurse or doctor.          Carbidopa-Levodopa ER 25-100 MG tablet controlled release Commonly known as: SINEMET CR Take 1 tablet by mouth daily. qam   clotrimazole-betamethasone cream Commonly known as: LOTRISONE Apply 1 Application topically as needed.   donepezil 5 MG tablet Commonly known as: ARICEPT Take 5 mg by mouth daily.   Eliquis 5 MG Tabs tablet Generic drug: apixaban Take 5 mg by mouth 2 (two) times daily.   fluocinonide ointment 0.05 % Commonly known as: LIDEX Apply 1 application  topically as needed.   metoprolol tartrate 12.5 mg Tabs tablet Commonly known as: LOPRESSOR Take 12.5 mg by mouth 2 (two) times daily.   MULTIVITAMIN PO Take by mouth.   OMEGA-3 FISH OIL PO Take by mouth daily.  OVER THE COUNTER MEDICATION 3 capsules daily. Turmeric - ging-olive-oreg-capyl 100mg  - 150mg  - 50mg - 150mg    PRESERVISION AREDS 2 PO Take by mouth 2 (two) times daily.        Social History:  reports that he has quit smoking. He has never used smokeless tobacco. He reports current alcohol use of about 7.0 standard drinks of alcohol per week. He reports that he does not use drugs.   Physical Exam: BP 126/79   Ht 5\' 9"  (1.753 m)   Wt 143 lb (64.9 kg)   BMI 21.12 kg/m   Constitutional:  Alert and oriented, No acute distress. HEENT: [place] AT, moist mucus membranes.  Trachea midline, no masses. Cardiovascular: No clubbing,  cyanosis, or edema. Respiratory: Normal respiratory effort, no increased work of breathing. GI: Abdomen is soft, nontender, nondistended, no abdominal masses GU: Prostate 40 grams, smooth without nodules Skin: No rashes, bruises or suspicious lesions. Neurologic: Grossly intact, no focal deficits, moving all 4 extremities. Psychiatric: Normal mood and affect.  Laboratory Data:  PVR was 69 cc's  Urinalysis  Dipstick/microscopy: Negative   Assessment & Plan:    1. Gross hematuria Most likely due to infection as he was also symptomatic and symptoms cleared with an antibiotic Bleeding most likely from an lower tract source and will schedule a renal ultrasound for upper tract screening Cystoscopy for lower tract evaluation  2. BPH with LUTs IPSS today  Most bothersome symptom is weak urinary stream PVR was 69 cc's We discussed medical management for BPH and he states that his symptoms have been long standing and not bothersome enough that he desires medication   [facility] [address], [address] [place], [place] [address] [phone]

## 2022-06-16 ENCOUNTER — Ambulatory Visit
Admission: RE | Admit: 2022-06-16 | Discharge: 2022-06-16 | Disposition: A | Discharge status: Home / Self Care | Payer: Medicare HMO | Source: Ambulatory Visit | Attending: Urology | Admitting: Urology

## 2022-06-16 DIAGNOSIS — R310 Gross hematuria: Secondary | ICD-10-CM | POA: Diagnosis present

## 2022-06-18 ENCOUNTER — Encounter: Payer: Self-pay | Admitting: Urology

## 2022-06-18 ENCOUNTER — Ambulatory Visit: Payer: Medicare HMO | Admitting: Urology

## 2022-06-18 VITALS — BP 127/82 | HR 76 | Ht 69.0 in | Wt 145.0 lb

## 2022-06-18 DIAGNOSIS — R319 Hematuria, unspecified: Secondary | ICD-10-CM | POA: Diagnosis not present

## 2022-06-18 DIAGNOSIS — Z87440 Personal history of urinary (tract) infections: Secondary | ICD-10-CM | POA: Diagnosis not present

## 2022-06-18 DIAGNOSIS — N390 Urinary tract infection, site not specified: Secondary | ICD-10-CM

## 2022-06-18 DIAGNOSIS — R310 Gross hematuria: Secondary | ICD-10-CM

## 2022-06-18 NOTE — Progress Notes (Signed)
   06/18/22  CC:  Chief Complaint  Patient presents with   Cysto    HPI: Refer to my prior note 06/05/2022.  RUS showed no abnormalities.  Denies recurrent gross hematuria.  Urinalysis today shows no RBCs  Blood pressure 127/82, pulse 76, height 5\' 9"  (1.753 m), weight 145 lb (65.8 kg). NED. A&Ox3.   No respiratory distress   Abd soft, NT, ND Normal phallus with bilateral descended testicles  Cystoscopy Procedure Note  Patient identification was confirmed, informed consent was obtained, and patient was prepped using Betadine solution.  Lidocaine jelly was administered per urethral meatus.     Pre-Procedure: - Inspection reveals a normal caliber urethral meatus.  Procedure: The flexible cystoscope was introduced without difficulty - No urethral strictures/lesions are present. -  Moderate lateral lobe enlargement  prostate  - Mild elevation bladder neck - Bilateral ureteral orifices identified - Bladder mucosa  reveals no ulcers, tumors, or lesions - No bladder stones -Mild trabeculation  Retroflexion shows no mucosal abnormalities or intravesical median lobe   Post-Procedure: - Patient tolerated the procedure well  Assessment/ Plan: 1 year follow-up with UA Instructed to call earlier for recurrent UTI symptoms or gross hematuria   [name], MD

## 2022-06-19 ENCOUNTER — Other Ambulatory Visit: Payer: Medicare Other

## 2022-06-19 LAB — URINALYSIS, COMPLETE
Bilirubin, UA: NEGATIVE
Glucose, UA: NEGATIVE
Ketones, UA: NEGATIVE
Leukocytes,UA: NEGATIVE
Nitrite, UA: NEGATIVE
Protein,UA: NEGATIVE
RBC, UA: NEGATIVE
Specific Gravity, UA: 1.01 (ref 1.005–1.030)
Urobilinogen, Ur: 0.2 mg/dL (ref 0.2–1.0)
pH, UA: 6 (ref 5.0–7.5)

## 2022-06-19 LAB — MICROSCOPIC EXAMINATION
Bacteria, UA: NONE SEEN
Epithelial Cells (non renal): NONE SEEN /hpf (ref 0–10)

## 2023-04-15 ENCOUNTER — Ambulatory Visit: Admit: 2023-04-15 | Admitting: Internal Medicine

## 2023-04-15 ENCOUNTER — Ambulatory Visit
Admission: RE | Admit: 2023-04-15 | Discharge: 2023-04-15 | Disposition: A | Discharge status: Home / Self Care | Attending: Internal Medicine | Admitting: Internal Medicine

## 2023-04-15 ENCOUNTER — Encounter
Admission: RE | Disposition: A | Discharge status: Home / Self Care | Payer: Self-pay | Source: Home / Self Care | Attending: Internal Medicine

## 2023-04-15 ENCOUNTER — Encounter: Payer: Self-pay | Admitting: Internal Medicine

## 2023-04-15 ENCOUNTER — Ambulatory Visit: Admitting: Certified Registered"

## 2023-04-15 DIAGNOSIS — Z87891 Personal history of nicotine dependence: Secondary | ICD-10-CM | POA: Insufficient documentation

## 2023-04-15 DIAGNOSIS — Z8673 Personal history of transient ischemic attack (TIA), and cerebral infarction without residual deficits: Secondary | ICD-10-CM | POA: Insufficient documentation

## 2023-04-15 DIAGNOSIS — F32A Depression, unspecified: Secondary | ICD-10-CM | POA: Diagnosis not present

## 2023-04-15 DIAGNOSIS — I4819 Other persistent atrial fibrillation: Secondary | ICD-10-CM

## 2023-04-15 DIAGNOSIS — I4891 Unspecified atrial fibrillation: Secondary | ICD-10-CM | POA: Insufficient documentation

## 2023-04-15 DIAGNOSIS — F419 Anxiety disorder, unspecified: Secondary | ICD-10-CM | POA: Diagnosis not present

## 2023-04-15 HISTORY — PX: CARDIOVERSION: SHX1299

## 2023-04-15 SURGERY — CARDIOVERSION
Anesthesia: General

## 2023-04-15 MED ORDER — OXYCODONE HCL 5 MG PO TABS: 5.0000 mg | ORAL_TABLET | Freq: Once | ORAL | Status: DC | PRN

## 2023-04-15 MED ORDER — ACETAMINOPHEN 10 MG/ML IV SOLN: 1000.0000 mg | Freq: Once | INTRAVENOUS | Status: DC | PRN

## 2023-04-15 MED ORDER — PROPOFOL 10 MG/ML IV BOLUS
INTRAVENOUS | Status: DC | PRN
  Administered 2023-04-15: 40 mg via INTRAVENOUS

## 2023-04-15 MED ORDER — OXYCODONE HCL 5 MG/5ML PO SOLN: 5.0000 mg | Freq: Once | ORAL | Status: DC | PRN

## 2023-04-15 MED ORDER — SODIUM CHLORIDE 0.9 % IV SOLN: INTRAVENOUS | Status: DC

## 2023-04-15 MED ORDER — FENTANYL CITRATE (PF) 100 MCG/2ML IJ SOLN: 25.0000 ug | INTRAMUSCULAR | Status: DC | PRN

## 2023-04-15 MED ORDER — DROPERIDOL 2.5 MG/ML IJ SOLN: 0.6250 mg | Freq: Once | INTRAMUSCULAR | Status: DC | PRN

## 2023-04-15 NOTE — Anesthesia Preprocedure Evaluation (Signed)
 Anesthesia Evaluation  Patient identified by MRN, date of birth, ID band Patient awake    Reviewed: Allergy & Precautions, H&P , NPO status , Patient's Chart, lab work & pertinent test results, reviewed documented beta blocker date and time   Airway Mallampati: II   Neck ROM: full    Dental  (+) Poor Dentition   Pulmonary neg pulmonary ROS, former smoker   Pulmonary exam normal        Cardiovascular Exercise Tolerance: Poor negative cardio ROS Normal cardiovascular exam Rhythm:regular Rate:Normal     Neuro/Psych  PSYCHIATRIC DISORDERS Anxiety Depression   Dementia CVA    GI/Hepatic negative GI ROS, Neg liver ROS,,,  Endo/Other  negative endocrine ROS    Renal/GU negative Renal ROS  negative genitourinary   Musculoskeletal   Abdominal   Peds  Hematology  (+) Blood dyscrasia, anemia   Anesthesia Other Findings Past Medical History: 12/2021: A-fib (HCC) No date: Anemia No date: Anxiety No date: Basal cell carcinoma of skin No date: Depression 2022: H/O bilateral cataract extraction 12/12/2020: History of 2019 novel coronavirus disease (COVID-19) No date: Mixed Alzheimer's and vascular dementia (HCC)     Comment:  a.) on donepezil No date: Osteoarthritis No date: Raynaud's disease without gangrene No date: Refusal of blood transfusions as patient is Jehovah's Witness No date: Right inguinal hernia 10/01/2018: Right thalamic stroke [facility])     Comment:  a.) MRI brain 10/01/2018 : cerebral white matter signal               changes with solitary chronic microhemorrhage in the               right thalamus; followed by neurology as an outpatient No date: Shuffling gait     Comment:  a.) taking carbidopa/levodopa No date: Squamous cell skin cancer Past Surgical History: 01/31/2020: CATARACT EXTRACTION W/PHACO; Right     Comment:  Procedure: CATARACT EXTRACTION PHACO AND INTRAOCULAR               LENS PLACEMENT (IOC)  MALYUGIN RIGHT 10.59 01:12.9 14.5%;               Surgeon: Lockie Mola, MD;  Location: [facility];  Service: Ophthalmology;  Laterality:               Right; 02/14/2020: CATARACT EXTRACTION W/PHACO; Left     Comment:  Procedure: CATARACT EXTRACTION PHACO AND INTRAOCULAR               LENS PLACEMENT (IOC) LEFT 7.83 01:04.5 12.1%;  Surgeon:               Lockie Mola, MD;  Location: [facility];              Service: Ophthalmology;  Laterality: Left; No date: COLONOSCOPY No date: TONSILLECTOMY BMI    Body Mass Index: 21.41 kg/m     Reproductive/Obstetrics negative OB ROS                             Anesthesia Physical Anesthesia Plan  ASA: 4  Anesthesia Plan: General   Post-op Pain Management:    Induction:   PONV Risk Score and Plan:   Airway Management Planned:   Additional Equipment:   Intra-op Plan:   Post-operative Plan:   Informed Consent: I have reviewed the patients History and Physical, chart, labs  and discussed the procedure including the risks, benefits and alternatives for the proposed anesthesia with the patient or authorized representative who has indicated his/her understanding and acceptance.     Dental Advisory Given  Plan Discussed with: CRNA  Anesthesia Plan Comments:        Anesthesia Quick Evaluation

## 2023-04-15 NOTE — CV Procedure (Signed)
 Electrical Cardioversion Procedure Note   Procedure: Electrical Cardioversion Indications:  Atrial Fibrillation  Procedure Details Consent: Risks of procedure as well as the alternatives and risks of each were explained to the (patient/caregiver).  Consent for procedure obtained. Time Out: Verified patient identification, verified procedure, site/side was marked, verified correct patient position, special equipment/implants available, medications/allergies/relevent history reviewed, required imaging and test results available.  Performed  Patient placed on cardiac monitor, pulse oximetry, supplemental oxygen as necessary.  Sedation given:  Propofol as per anesthesia Pacer pads placed anterior and posterior chest.  Cardioverted 1 time(s).  Cardioverted at 200J.  Evaluation Findings: Post procedure EKG shows: NSR Complications: None Patient did tolerate procedure well.   Dorothyann Peng MD 04/15/23

## 2023-04-15 NOTE — Transfer of Care (Signed)
 Immediate Anesthesia Transfer of Care Note  Patient: Jacob Salas  Procedure(s) Performed: CARDIOVERSION  Patient Location: Cath Lab  Anesthesia Type:General  Level of Consciousness: drowsy  Airway & Oxygen Therapy: Patient Spontanous Breathing and Patient connected to face mask oxygen  Post-op Assessment: Report given to RN  Post vital signs: stable  Last Vitals:  Vitals Value Taken Time  BP 144/86 04/15/23 0736  Temp    Pulse 65 04/15/23 0739  Resp 16 04/15/23 0739  SpO2 100 % 04/15/23 0739    Last Pain:  Vitals:   04/15/23 0652  TempSrc: Oral  PainSc: 0-No pain         Complications: No notable events documented.

## 2023-04-16 ENCOUNTER — Encounter: Payer: Self-pay | Admitting: Internal Medicine

## 2023-04-19 NOTE — Anesthesia Postprocedure Evaluation (Signed)
 Anesthesia Post Note  Patient: ALBAN MARUCCI  Procedure(s) Performed: CARDIOVERSION  Patient location during evaluation: PACU Anesthesia Type: General Level of consciousness: awake and alert Pain management: pain level controlled Vital Signs Assessment: post-procedure vital signs reviewed and stable Respiratory status: spontaneous breathing, nonlabored ventilation, respiratory function stable and patient connected to nasal cannula oxygen Cardiovascular status: blood pressure returned to baseline and stable Postop Assessment: no apparent nausea or vomiting Anesthetic complications: no   No notable events documented.   Last Vitals:  Vitals:   04/15/23 0815 04/15/23 0830  BP: (!) 141/91 (!) 158/101  Pulse: 74 70  Resp: 15 14  Temp:    SpO2: 98% 100%    Last Pain:  Vitals:   04/15/23 0830  TempSrc:   PainSc: 0-No pain                 Yevette Edwards

## 2023-06-18 ENCOUNTER — Ambulatory Visit: Payer: Self-pay | Admitting: Urology

## 2023-10-13 ENCOUNTER — Ambulatory Visit: Payer: Self-pay | Admitting: Surgery

## 2023-10-13 NOTE — H&P (Signed)
 Subjective:   CC: Non-recurrent unilateral inguinal hernia without obstruction or gangrene [K40.90]  HPI: Returns for evaluation of above.   History of Present Illness    Past Medical History:  has a past medical history of Arthritis, Atrial fibrillation (CMS/HHS-HCC) (04/23/2022), Basal cell carcinoma in situ of skin, BPH without urinary obstruction (07/02/2022), Cellulitis of finger of right hand (12/21/2017), Depression, Hernia, inguinal, right (07/02/2022), Moderate Lewy body dementia with anxiety (CMS-HCC) (07/02/2022), Raynaud's disease without gangrene (06/13/2020), Refusal of blood product (06/02/2015), and Squamous cell carcinoma in situ of skin.  Past Surgical History:  Past Surgical History:  Procedure Laterality Date   ADENOIDECTOMY     MOES     TONSILLECTOMY      Family History: family history includes Alzheimer's disease in his mother; Cancer in his father, maternal grandmother, and paternal grandmother; Colon cancer in his father; Coronary Artery Disease (Blocked arteries around heart) in his father; High blood pressure (Hypertension) in his father and son; Hyperlipidemia (Elevated cholesterol) in his father; Skin cancer in his mother.  Social History:  reports that he has quit smoking. He has never used smokeless tobacco. He reports current alcohol use of about 9.0 standard drinks of alcohol per week. He reports that he does not currently use drugs.  Current Medications: has a current medication list which includes the following prescription(s): clotrimazole-betamethasone, donepezil, eliquis, fluocinonide, ipratropium, lisinopril, metoprolol tartrate, and vit c/e/zn/coppr/lutein/zeaxan.  Allergies:  Allergies as of 10/13/2023   (No Known Allergies)    ROS:  A 15 point review of systems was performed and pertinent positives and negatives noted in HPI   Objective:     BP 115/70   Pulse 64   Ht 175.3 cm (5' 9)   Wt 62.6 kg (138 lb)   BMI 20.38 kg/m    Constitutional :  Alert, cooperative, no distress  Lymphatics/Throat:  Supple, no lymphadenopathy  Respiratory:  clear to auscultation bilaterally  Cardiovascular:  regular rate and rhythm  Gastrointestinal: soft, non-tender; bowel sounds normal; no masses,  no organomegaly. inguinal hernia noted.  large, reducible, no overlying skin changes, and right  Musculoskeletal: Steady gait and movement  Skin: Cool and moist  Psychiatric: Normal affect, non-agitated, not confused       LABS:  N/a   RADS: N/a Assessment:       Non-recurrent unilateral inguinal hernia without obstruction or gangrene [K40.90]  Plan:     1. Non-recurrent unilateral inguinal hernia without obstruction or gangrene [K40.90]   Discussed the risk of surgery including recurrence, which can be up to 50% in the case of incisional or complex hernias, possible use of prosthetic materials (mesh) and the increased risk of mesh infxn if used, bleeding, chronic pain, post-op infxn, post-op SBO or ileus, and possible re-operation to address said risks. The risks of general anesthetic, if used, includes MI, CVA, sudden death or even reaction to anesthetic medications also discussed. Alternatives include continued observation.  Benefits include possible symptom relief, prevention of incarceration, strangulation, enlargement in size over time, and the risk of emergency surgery in the face of strangulation.   Typical post-op recovery time of 3-5 days with 2 weeks of activity restrictions were also discussed.  ED return precautions given for sudden increase in pain, size of hernia with accompanying fever, nausea, and/or vomiting.  The patient verbalized understanding and all questions were answered to the patient's satisfaction.   2. Patient has elected to proceed with surgical treatment. Procedure will be scheduled.   RIGHT, Non-recurrent unilateral inguinal hernia  without obstruction or gangrene [K40.90], robotic assisted  laparoscopic [phone], possible left if noted during procedure.  Also possiblity of converting to open procedure if hernia is too big to repair laparoscopically  Hold Eliquis 3 days prior   labs/images/medications/previous chart entries reviewed personally and relevant changes/updates noted above.

## 2023-10-13 NOTE — H&P (View-Only) (Signed)
 Subjective:   CC: Non-recurrent unilateral inguinal hernia without obstruction or gangrene [K40.90]  HPI: Returns for evaluation of above.   History of Present Illness    Past Medical History:  has a past medical history of Arthritis, Atrial fibrillation (CMS/HHS-HCC) (04/23/2022), Basal cell carcinoma in situ of skin, BPH without urinary obstruction (07/02/2022), Cellulitis of finger of right hand (12/21/2017), Depression, Hernia, inguinal, right (07/02/2022), Moderate Lewy body dementia with anxiety (CMS-HCC) (07/02/2022), Raynaud's disease without gangrene (06/13/2020), Refusal of blood product (06/02/2015), and Squamous cell carcinoma in situ of skin.  Past Surgical History:  Past Surgical History:  Procedure Laterality Date   ADENOIDECTOMY     MOES     TONSILLECTOMY      Family History: family history includes Alzheimer's disease in his mother; Cancer in his father, maternal grandmother, and paternal grandmother; Colon cancer in his father; Coronary Artery Disease (Blocked arteries around heart) in his father; High blood pressure (Hypertension) in his father and son; Hyperlipidemia (Elevated cholesterol) in his father; Skin cancer in his mother.  Social History:  reports that he has quit smoking. He has never used smokeless tobacco. He reports current alcohol use of about 9.0 standard drinks of alcohol per week. He reports that he does not currently use drugs.  Current Medications: has a current medication list which includes the following prescription(s): clotrimazole-betamethasone, donepezil, eliquis, fluocinonide, ipratropium, lisinopril, metoprolol tartrate, and vit c/e/zn/coppr/lutein/zeaxan.  Allergies:  Allergies as of 10/13/2023   (No Known Allergies)    ROS:  A 15 point review of systems was performed and pertinent positives and negatives noted in HPI   Objective:     BP 115/70   Pulse 64   Ht 175.3 cm (5' 9)   Wt 62.6 kg (138 lb)   BMI 20.38 kg/m    Constitutional :  Alert, cooperative, no distress  Lymphatics/Throat:  Supple, no lymphadenopathy  Respiratory:  clear to auscultation bilaterally  Cardiovascular:  regular rate and rhythm  Gastrointestinal: soft, non-tender; bowel sounds normal; no masses,  no organomegaly. inguinal hernia noted.  large, reducible, no overlying skin changes, and right  Musculoskeletal: Steady gait and movement  Skin: Cool and moist  Psychiatric: Normal affect, non-agitated, not confused       LABS:  N/a   RADS: N/a Assessment:       Non-recurrent unilateral inguinal hernia without obstruction or gangrene [K40.90]  Plan:     1. Non-recurrent unilateral inguinal hernia without obstruction or gangrene [K40.90]   Discussed the risk of surgery including recurrence, which can be up to 50% in the case of incisional or complex hernias, possible use of prosthetic materials (mesh) and the increased risk of mesh infxn if used, bleeding, chronic pain, post-op infxn, post-op SBO or ileus, and possible re-operation to address said risks. The risks of general anesthetic, if used, includes MI, CVA, sudden death or even reaction to anesthetic medications also discussed. Alternatives include continued observation.  Benefits include possible symptom relief, prevention of incarceration, strangulation, enlargement in size over time, and the risk of emergency surgery in the face of strangulation.   Typical post-op recovery time of 3-5 days with 2 weeks of activity restrictions were also discussed.  ED return precautions given for sudden increase in pain, size of hernia with accompanying fever, nausea, and/or vomiting.  The patient verbalized understanding and all questions were answered to the patient's satisfaction.   2. Patient has elected to proceed with surgical treatment. Procedure will be scheduled.   RIGHT, Non-recurrent unilateral inguinal hernia  without obstruction or gangrene [K40.90], robotic assisted  laparoscopic [phone], possible left if noted during procedure.  Also possiblity of converting to open procedure if hernia is too big to repair laparoscopically  Hold Eliquis 3 days prior   labs/images/medications/previous chart entries reviewed personally and relevant changes/updates noted above.

## 2023-10-28 ENCOUNTER — Encounter
Admission: RE | Admit: 2023-10-28 | Discharge: 2023-10-28 | Disposition: A | Discharge status: Home / Self Care | Source: Ambulatory Visit | Attending: Surgery | Admitting: Surgery

## 2023-10-28 ENCOUNTER — Other Ambulatory Visit: Payer: Self-pay

## 2023-10-28 VITALS — Ht 69.0 in | Wt 138.0 lb

## 2023-10-28 DIAGNOSIS — Z01812 Encounter for preprocedural laboratory examination: Secondary | ICD-10-CM

## 2023-10-28 DIAGNOSIS — I10 Essential (primary) hypertension: Secondary | ICD-10-CM

## 2023-10-28 DIAGNOSIS — K4090 Unilateral inguinal hernia, without obstruction or gangrene, not specified as recurrent: Secondary | ICD-10-CM

## 2023-10-28 HISTORY — DX: Nonrheumatic mitral (valve) prolapse: I34.1

## 2023-10-28 HISTORY — DX: Long term (current) use of anticoagulants: Z79.01

## 2023-10-28 HISTORY — DX: Benign prostatic hyperplasia without lower urinary tract symptoms: N40.0

## 2023-10-28 NOTE — Patient Instructions (Signed)
 Your procedure is scheduled on:11-04-23 Thursday Report to the Registration Desk on the 1st floor of the [facility].Then proceed to the 2nd floor Surgery Desk To find out your arrival time, please call [phone] between 1PM - 3PM on:11-03-23 Wednesday If your arrival time is 6:00 am, do not arrive before that time as the [facility] entrance doors do not open until 6:00 am.  REMEMBER: Instructions that are not followed completely may result in serious medical risk, up to and including death; or upon the discretion of your surgeon and anesthesiologist your surgery may need to be rescheduled.  Do not eat food after midnight the night before surgery.  No gum chewing or hard candies.  You may however, drink CLEAR liquids up to 2 hours before you are scheduled to arrive for your surgery. Do not drink anything within 2 hours of your scheduled arrival time.  Clear liquids include: - water  - apple juice without pulp - gatorade (not RED colors) - black coffee or tea (Do NOT add milk or creamers to the coffee or tea) Do NOT drink anything that is not on this list.  One week prior to surgery:Stop NOW (10-28-23) Stop Anti-inflammatories (NSAIDS) such as Advil, Aleve, Ibuprofen, Motrin, Naproxen, Naprosyn and Aspirin based products such as Excedrin, Goody's Powder, BC Powder. Stop ANY OVER THE COUNTER supplements until after surgery (Multivitamin, Preservision AREDS, Turmeric-Ginger)  You may however, continue to take Tylenol  if needed for pain up until the day of surgery.  Stop apixaban (ELIQUIS) 3 days prior to surgery-Last dose will be on 10-31-23 Sunday  Continue taking all of your other prescription medications up until the day of surgery.  ON THE DAY OF SURGERY ONLY TAKE THESE MEDICATIONS WITH SIPS OF WATER: -donepezil (ARICEPT)  -metoprolol tartrate (LOPRESSOR)   No Alcohol for 24 hours before or after surgery.  No Smoking including e-cigarettes for 24 hours before surgery.   No chewable tobacco products for at least 6 hours before surgery.  No nicotine patches on the day of surgery.  Do not use any recreational drugs for at least a week (preferably 2 weeks) before your surgery.  Please be advised that the combination of cocaine and anesthesia may have negative outcomes, up to and including death. If you test positive for cocaine, your surgery will be cancelled.  On the morning of surgery brush your teeth with toothpaste and water, you may rinse your mouth with mouthwash if you wish. Do not swallow any toothpaste or mouthwash.  Use CHG Soap as directed on instruction sheet.  Do not wear jewelry, make-up, hairpins, clips or nail polish.  For welded (permanent) jewelry: bracelets, anklets, waist bands, etc.  Please have this removed prior to surgery.  If it is not removed, there is a chance that hospital personnel will need to cut it off on the day of surgery.  Do not wear lotions, powders, or perfumes.   Do not shave body hair from the neck down 48 hours before surgery.  Contact lenses, hearing aids and dentures may not be worn into surgery.  Do not bring valuables to the hospital. [facility] is not responsible for any missing/lost belongings or valuables.   Notify your doctor if there is any change in your medical condition (cold, fever, infection).  Wear comfortable clothing (specific to your surgery type) to the hospital.  After surgery, you can help prevent lung complications by doing breathing exercises.  Take deep breaths and cough every 1-2 hours. Your doctor may order  a device called an Incentive Spirometer to help you take deep breaths. When coughing or sneezing, hold a pillow firmly against your incision with both hands. This is called "splinting." Doing this helps protect your incision. It also decreases belly discomfort.  If you are being admitted to the hospital overnight, leave your suitcase in the car. After surgery it may be brought to  your room.  In case of increased patient census, it may be necessary for you, the patient, to continue your postoperative care in the Same Day Surgery department.  If you are being discharged the day of surgery, you will not be allowed to drive home. You will need a responsible individual to drive you home and stay with you for 24 hours after surgery.   If you are taking public transportation, you will need to have a responsible individual with you.  Please call the Pre-admissions Testing Dept. at [phone] if you have any questions about these instructions.  Surgery Visitation Policy:  Patients having surgery or a procedure may have two visitors.  Children under the age of 77 must have an adult with them who is not the patient.                                                                                                             Preparing for Surgery with CHLORHEXIDINE GLUCONATE (CHG) Soap  Chlorhexidine Gluconate (CHG) Soap  o An antiseptic cleaner that kills germs and bonds with the skin to continue killing germs even after washing  o Used for showering the night before surgery and morning of surgery  Before surgery, you can play an important role by reducing the number of germs on your skin.  CHG (Chlorhexidine gluconate) soap is an antiseptic cleanser which kills germs and bonds with the skin to continue killing germs even after washing.  Please do not use if you have an allergy to CHG or antibacterial soaps. If your skin becomes reddened/irritated stop using the CHG.  1. Shower the NIGHT BEFORE SURGERY with CHG soap.  2. If you choose to wash your hair, wash your hair first as usual with your normal shampoo.  3. After shampooing, rinse your hair and body thoroughly to remove the shampoo.  4. Use CHG as you would any other liquid soap. You can apply CHG directly to the skin and wash gently with a clean washcloth.  5. Apply the CHG soap to your body only from the  neck down. Do not use on open wounds or open sores. Avoid contact with your eyes, ears, mouth, and genitals (private parts). Wash face and genitals (private parts) with your normal soap.  6. Wash thoroughly, paying special attention to the area where your surgery will be performed.  7. Thoroughly rinse your body with warm water.  8. Do not shower/wash with your normal soap after using and rinsing off the CHG soap.  9. Do not use lotions, oils, etc., after showering with CHG.  10. Pat yourself dry with a clean towel.  11. Wear clean  pajamas to bed the night before surgery.  12. Place clean sheets on your bed the night of your shower and do not sleep with pets.  13. Do not apply any deodorants/lotions/powders.  14. Please wear clean clothes to the hospital.  15. Remember to brush your teeth with your regular toothpaste.   Merchandiser, retail to address health-related social needs:  [email or web address]

## 2023-10-29 ENCOUNTER — Encounter
Admission: RE | Admit: 2023-10-29 | Discharge: 2023-10-29 | Disposition: A | Discharge status: Home / Self Care | Source: Ambulatory Visit | Attending: Surgery | Admitting: Surgery

## 2023-10-29 DIAGNOSIS — I10 Essential (primary) hypertension: Secondary | ICD-10-CM | POA: Insufficient documentation

## 2023-10-29 DIAGNOSIS — Z01812 Encounter for preprocedural laboratory examination: Secondary | ICD-10-CM | POA: Diagnosis present

## 2023-10-29 LAB — CBC
HCT: 36 % — ABNORMAL LOW (ref 39.0–52.0)
Hemoglobin: 12 g/dL — ABNORMAL LOW (ref 13.0–17.0)
MCH: 32.3 pg (ref 26.0–34.0)
MCHC: 33.3 g/dL (ref 30.0–36.0)
MCV: 96.8 fL (ref 80.0–100.0)
Platelets: 180 K/uL (ref 150–400)
RBC: 3.72 MIL/uL — ABNORMAL LOW (ref 4.22–5.81)
RDW: 13 % (ref 11.5–15.5)
WBC: 6.6 K/uL (ref 4.0–10.5)
nRBC: 0 % (ref 0.0–0.2)

## 2023-10-29 LAB — BASIC METABOLIC PANEL WITH GFR
Anion gap: 6 (ref 5–15)
BUN: 16 mg/dL (ref 8–23)
CO2: 25 mmol/L (ref 22–32)
Calcium: 9.2 mg/dL (ref 8.9–10.3)
Chloride: 106 mmol/L (ref 98–111)
Creatinine, Ser: 0.89 mg/dL (ref 0.61–1.24)
GFR, Estimated: >60 mL/min (ref >60)
Glucose, Bld: 108 mg/dL — ABNORMAL HIGH (ref 70–99)
Potassium: 4.4 mmol/L (ref 3.5–5.1)
Sodium: 137 mmol/L (ref 135–145)

## 2023-11-01 ENCOUNTER — Encounter: Payer: Self-pay | Admitting: Surgery

## 2023-11-01 NOTE — Progress Notes (Signed)
 Perioperative / Anesthesia Services  Pre-Admission Testing Clinical Review / Pre-Operative Anesthesia Consult  Date: 11/01/23  PATIENT DEMOGRAPHICS: Name: Jacob Salas DOB: 1932/03/09 MRN:   [id number]  Note: Available PAT nursing documentation and vital signs have been reviewed. Clinical nursing staff has updated patient's PMH/PSHx, current medication list, and drug allergies/intolerances to ensure complete and comprehensive history available to assist care teams in MDM as it pertains to the aforementioned surgical procedure and anticipated anesthetic course. Extensive review of available clinical information personally performed. Nursing documentation reviewed. [place] PMH and PSHx updated with any diagnoses and/or procedures that I have knowledge of that may have been inadvertently omitted during his intake with the pre-admission testing department's nursing staff.  PLANNED SURGICAL PROCEDURE(S):   Case: [id number] Date/Time: 11/04/23 1010   Procedure: REPAIR, HERNIA, INGUINAL, ROBOT-ASSISTED, LAPAROSCOPIC, USING MESH (Right: Abdomen) - Possible left if noted during procedure, also a possibility of converting to open if to big too repair laparascopically.   Anesthesia type: General   Pre-op diagnosis: K40.90 Right non recurrent unilateral inguinal hernia without obstruction or gangrene   Location: [facility] OR ROOM 04 / [facility] ORS FOR ANESTHESIA GROUP   Surgeons: Tye Millet, DO        CLINICAL DISCUSSION: Jacob Salas is a 89 y.o. male who is submitted for pre-surgical anesthesia review and clearance prior to him undergoing the above procedure. Patient has never been a smoker in the past. Pertinent PMH includes: PAF, MVP, RIGHT thalamic CVA, chronic cerebral microvascular disease, LAFB, aortic root dilatation, dyspnea, peripheral edema, anemia, RIGHT inguinal hernia, BPH, OA, depression, anxiety, Lewy body dementia.  Patient is followed by cardiology Philippe, MD). He was last seen  in the cardiology clinic on 10/11/2023; notes reviewed. At the time of his clinic visit, patient doing well overall from a cardiovascular perspective. Patient denied any chest pain, shortness of breath, PND, orthopnea, palpitations, significant peripheral edema, weakness, fatigue, vertiginous symptoms, or presyncope/syncope. Patient with a past medical history significant for cardiovascular diagnoses. Documented physical exam was grossly benign, providing no evidence of acute exacerbation and/or decompensation of the patient's known cardiovascular conditions.  Patient underwent MRI imaging of the brain on 10/01/2018 revealing cerebral white matter signal changes with solitary chronic microhemorrhage in the RIGHT thalamus.  Patient has no significant neurological deficits following this event.  Patient was diagnosed with atrial fibrillation during preoperative workup back in 12/2021. Patient underwent DCCV procedure on 04/15/2023, at which time he received a single 200 J synchronized cardioversion converting his atrial arrhythmia to NSR.    Most recent TTE performed on 04/13/2023 revealed a moderately reduced left ventricular systolic function with an EF of 35%. There was no LVH.  There was global left ventricular hypokinesis.  Left ventricular diastolic Doppler parameters were indeterminate. GLS -11% (normal range <-18%).  Biatrial enlargement was observed.  Right ventricular size and function normal with a TAPSE measuring 1.8 cm  (normal range >/= 1.6 cm). There was mild tricuspid, trivial pulmonic, and severe mitral valve regurgitation.  Mitral valve myxomatous.  All transvalvular gradients were noted to be normal providing no evidence of hemodynamically significant valvular stenosis.  Ascending aortic root mildly enlarged measuring up to 4.1 cm.  Again, has atrial fibrillation diagnosis; CHA2DS2-VASc Score = 4 (age x 2, CVA x 2).  Patient underwent DCCV procedure on 04/15/2023, at which time he received a  single 200 J synchronized cardioversion converting his atrial arrhythmia to NSR.  Rate and rhythm are currently being maintained on oral metoprolol tartrate. Patient is  chronically anticoagulated using standard dose oral apixaban.  He is reportedly compliant with therapy with no evidence or reports of GI/GU related bleeding. Blood pressure well-controlled at 120/60 mmHg on currently prescribed ACEi (lisinopril) and beta-blocker (metoprolol tartrate) therapies. Patient is not currently taking any type of lipid-lowering therapies for ASCVD prevention.  He is not diabetic.  Patient does not have an OSAH diagnosis. Functional capacity limited somewhat by patient's age and multiple medical comorbidities, including previous CVA and dementia. With that said, patient able to complete all of his ADLs/IADLs without cardiovascular limitation. Per the DASI, patient able to achieve at least 4 METS of physical activity without experiencing any significant degrees of angina/anginal equivalent symptoms. No changes were made to his medication regimen.  Patient to follow-up with outpatient cardiology in 6 months or sooner if needed.  Jacob Salas is scheduled for an elective REPAIR, HERNIA, INGUINAL, ROBOT-ASSISTED, LAPAROSCOPIC, USING MESH (Right: Abdomen) on 11/04/2023 with Dr. Henriette Pierre, DO. Given patient's past medical history significant for cardiovascular diagnoses, presurgical cardiac clearance was sought by the PAT team. Per cardiology, this patient is optimized for surgery and may proceed with the planned procedural course with a LOW risk of significant perioperative cardiovascular complications.  Again, this patient is on daily oral anticoagulation therapy using a DOAC medication.  He has been instructed on recommendations for holding his apixaban for 3 days prior to his procedure with plans to restart as soon as postoperative bleeding risk felt to be minimized by his primary attending surgeon. The patient has  been instructed that his last dose should be on 10/31/2023.  Patient denies previous perioperative complications with anesthesia in the past. In review his EMR, it is noted that patient underwent a general anesthetic course here at [facility] ([facility]) in 04/2023 without documented complications.   MOST RECENT VITAL SIGNS:    10/28/2023    3:00 PM 04/15/2023    8:30 AM 04/15/2023    8:15 AM  Vitals with BMI  Height 5' 9    Weight 138 lbs    BMI 20.37    Systolic  158 141  Diastolic  898 91  Pulse  70 74   PROVIDERS/SPECIALISTS: NOTE: Primary physician provider listed below. Patient may have been seen by APP or partner within same practice.   PROVIDER ROLE / SPECIALTY LAST SHERLEAN Pierre Henriette, DO General Surgery (Surgeon) 10/13/2023  Delfina Pao, MD Primary Care Provider 07/06/2023  Florencio Shine, MD Cardiology 10/11/2023  Maree Hila, MD Neurology 05/18/2023   ALLERGIES: No Known Allergies  CURRENT HOME MEDICATIONS: No current facility-administered medications for this encounter.    apixaban (ELIQUIS) 5 MG TABS tablet   clotrimazole-betamethasone (LOTRISONE) cream   donepezil (ARICEPT) 5 MG tablet   fluocinonide ointment (LIDEX) 0.05 %   ipratropium (ATROVENT) 0.06 % nasal spray   lisinopril (ZESTRIL) 5 MG tablet   metoprolol tartrate (LOPRESSOR) 25 MG tablet   Multiple Vitamin (MULTIVITAMIN PO)   Multiple Vitamins-Minerals (PRESERVISION AREDS 2 PO)   TURMERIC-GINGER PO   HISTORY: Past Medical History:  Diagnosis Date   Anemia    Anticoagulated on apixaban    Anxiety    Basal cell carcinoma of skin    BPH (benign prostatic hyperplasia)    Depression    Dyspnea    H/O bilateral cataract extraction 2022   History of 2019 novel coronavirus disease (COVID-19) 12/12/2020   Moderate Lewy body dementia (HCC)    a.) on acetylcholinesterase inhibitor (donepazil)   MVP (  mitral valve prolapse)    Osteoarthritis    PAF (paroxysmal  atrial fibrillation) (HCC) 12/2021   a.) CHA2DS2VASc = 4 (age x 2, CVA x 2) as of 11/01/2023; b.) s/p DCCV 04/15/2023 --> 200 J x 1 --> NSR; c.) rate/rhythm maintained on oral metoprolol tartrate; chronically anticoagulated with apixaban   Peripheral edema    Raynaud's disease without gangrene    Refusal of blood transfusions as patient is Jehovah's Witness    Right inguinal hernia    Right thalamic stroke (HCC) 10/01/2018   a.) MRI brain 10/01/2018 : cerebral white matter signal changes with solitary chronic microhemorrhage in the right thalamus; followed by neurology as an outpatient   Shuffling gait    Squamous cell skin cancer    Past Surgical History:  Procedure Laterality Date   CARDIOVERSION N/A 04/15/2023   Procedure: CARDIOVERSION;  Surgeon: Florencio Cara BIRCH, MD;  Location: [facility] ORS;  Service: Cardiovascular;  Laterality: N/A;   CATARACT EXTRACTION W/PHACO Right 01/31/2020   Procedure: CATARACT EXTRACTION PHACO AND INTRAOCULAR LENS PLACEMENT (IOC) MALYUGIN RIGHT 10.59 01:12.9 14.5%;  Surgeon: Mittie Gaskin, MD;  [facility];  Service: Ophthalmology;  Laterality: Right;   CATARACT EXTRACTION W/PHACO Left 02/14/2020   Procedure: CATARACT EXTRACTION PHACO AND INTRAOCULAR LENS PLACEMENT (IOC) LEFT 7.83 01:04.5 12.1%;  Surgeon: Mittie Gaskin, MD;  Location: [facility];  Service: Ophthalmology;  Laterality: Left;   COLONOSCOPY     TONSILLECTOMY     No family history on file. Social History   Tobacco Use   Smoking status: Former    Types: Cigarettes   Smokeless tobacco: Never   Tobacco comments:    as teenager  Substance Use Topics   Alcohol use: Yes    Alcohol/week: 7.0 standard drinks of alcohol    Types: 7 Cans of beer per week    Comment: rarely   LABS:  Hospital Outpatient Visit on 10/29/2023  Component Date Value Ref Range Status   WBC 10/29/2023 6.6  4.0 - 10.5 K/uL Final   RBC 10/29/2023 3.72 (L)  4.22 - 5.81 MIL/uL Final    Hemoglobin 10/29/2023 12.0 (L)  13.0 - 17.0 g/dL Final   HCT 89/82/7974 36.0 (L)  39.0 - 52.0 % Final   MCV 10/29/2023 96.8  80.0 - 100.0 fL Final   MCH 10/29/2023 32.3  26.0 - 34.0 pg Final   MCHC 10/29/2023 33.3  30.0 - 36.0 g/dL Final   RDW 89/82/7974 13.0  11.5 - 15.5 % Final   Platelets 10/29/2023 180  150 - 400 K/uL Final   nRBC 10/29/2023 0.0  0.0 - 0.2 % Final   Performed at [facility], [address] [place], [place] [address]   Sodium 10/29/2023 137  135 - 145 mmol/L Final   Potassium 10/29/2023 4.4  3.5 - 5.1 mmol/L Final   Chloride 10/29/2023 106  98 - 111 mmol/L Final   CO2 10/29/2023 25  22 - 32 mmol/L Final   Glucose, Bld 10/29/2023 108 (H)  70 - 99 mg/dL Final   Glucose reference range applies only to samples taken after fasting for at least 8 hours.   BUN 10/29/2023 16  8 - 23 mg/dL Final   Creatinine, Ser 10/29/2023 0.89  0.61 - 1.24 mg/dL Final   Calcium 89/82/7974 9.2  8.9 - 10.3 mg/dL Final   GFR, Estimated 10/29/2023 >60  >60 mL/min Final   Comment: (NOTE) Calculated using the CKD-EPI Creatinine Equation (2021)    Anion gap 10/29/2023 6  5 - 15 Final   Performed at [facility], [address] [place], [place] [address]    ECG: Date: 10/11/2023  Time ECG obtained: 1043 AM Rate: 60 bpm Rhythm: atrial fibrillation; LAFB Axis (leads I and aVF): normal Intervals: QRS 110 ms. QTc 440 ms. ST segment and T wave changes: No evidence of acute T wave abnormalities or significant ST segment elevation or depression.  Evidence of a possible, age undetermined, prior infarct:  No Comparison: Previous tracing obtained on 04/15/2023 showed sinus rhythm at a rate of 70 bpm with evidence of an age undetermined anteroseptal infarct.   IMAGING / PROCEDURES: TRANSTHORACIC ECHOCARDIOGRAM performed on 04/13/2023 Moderately reduced left ventricular systolic function with an EF of 35% No LVH No regional wall motion abnormalities Left ventricular  diastolic Doppler parameters indeterminate Normal right ventricular size and function  Severe MR Trivial PR Mild TR Normal gradients; no valvular stenosis Ascending aortic root mildly enlarged at 4.1 cm  MR BRAIN WO CONTRAST performed on 10/01/2018 No acute intracranial abnormality. Moderate for age nonspecific cerebral white matter signal changes with solitary chronic microhemorrhage in the right thalamus. NeuroQuant volumetric analysis of the brain, see details on [employer].  IMPRESSION AND PLAN: Jacob Salas has been referred for pre-anesthesia review and clearance prior to him undergoing the planned anesthetic and procedural courses. Available labs, pertinent testing, and imaging results were personally reviewed by me in preparation for upcoming operative/procedural course. [facility] medical record has been updated following extensive record review and patient interview with PAT staff.   This patient has been appropriately cleared by cardiology with an overall LOW risk of patient experiencing significant perioperative cardiovascular complications. Based on clinical review performed today (11/01/23), barring any significant acute changes in the patient's overall condition, it is anticipated that he will be able to proceed with the planned surgical intervention. Any acute changes in clinical condition may necessitate his procedure being postponed and/or cancelled. Patient will meet with anesthesia team (MD and/or CRNA) on the day of his procedure for preoperative evaluation/assessment. Questions regarding anesthetic course will be fielded at that time.   Pre-surgical instructions were reviewed with the patient during his PAT appointment, and questions were fielded to satisfaction by PAT clinical staff. He has been instructed on which medications that he will need to hold prior to surgery, as well as the ones that have been deemed safe/appropriate to take on the day of his procedure. As  part of the general education provided by PAT, patient made aware both verbally and in writing, that he would need to abstain from the use of any illegal substances during his perioperative course. He was advised that failure to follow the provided instructions could necessitate case cancellation or result in serious perioperative complications up to and including death. Patient encouraged to contact PAT and/or his surgeon's office to discuss any questions or concerns that may arise prior to surgery; verbalized understanding.   Dorise Pereyra, MSN, APRN, FNP-C, CEN [facility]  Perioperative Services Nurse Practitioner Phone: [phone] Fax: [phone] 11/01/23 11:11 AM  NOTE: This note has been prepared using Dragon dictation software. Despite my best ability to proofread, there is always the potential that unintentional transcriptional errors may still occur from this process.

## 2023-11-03 NOTE — Progress Notes (Signed)
 Subjective:   CC: Non-recurrent unilateral inguinal hernia without obstruction or gangrene [K40.90]  HPI: Returns inquiring about recent lab results  History of Present Illness    Past Medical History:  has a past medical history of Arthritis, Atrial fibrillation (CMS/HHS-HCC) (04/23/2022), Basal cell carcinoma in situ of skin, BPH without urinary obstruction (07/02/2022), Cellulitis of finger of right hand (12/21/2017), Depression, Hernia, inguinal, right (07/02/2022), Moderate Lewy body dementia with anxiety (CMS-HCC) (07/02/2022), Raynaud's disease without gangrene (06/13/2020), Refusal of blood product (06/02/2015), and Squamous cell carcinoma in situ of skin.  Past Surgical History:  Past Surgical History:  Procedure Laterality Date  . ADENOIDECTOMY    . MOES    . TONSILLECTOMY      Family History: family history includes Alzheimer's disease in his mother; Cancer in his father, maternal grandmother, and paternal grandmother; Colon cancer in his father; Coronary Artery Disease (Blocked arteries around heart) in his father; High blood pressure (Hypertension) in his father and son; Hyperlipidemia (Elevated cholesterol) in his father; Skin cancer in his mother.  Social History:  reports that he has quit smoking. He has never used smokeless tobacco. He reports current alcohol use of about 9.0 standard drinks of alcohol per week. He reports that he does not currently use drugs.  Current Medications: has a current medication list which includes the following prescription(s): clotrimazole-betamethasone, donepezil, eliquis, fluocinonide, ipratropium, lisinopril, metoprolol tartrate, and vit c/e/zn/coppr/lutein/zeaxan.  Allergies:  Allergies as of 11/03/2023  . (No Known Allergies)    ROS:  A 15 point review of systems was performed and pertinent positives and negatives noted in HPI   Objective:     BP (!) 142/78   Pulse 56   Ht 175.3 cm (5' 9)   Wt 62.6 kg (138 lb)   BMI 20.38  kg/m   Constitutional :  Alert, cooperative, no distress  Lymphatics/Throat:  Supple, no lymphadenopathy  Respiratory:  clear to auscultation bilaterally  Cardiovascular:  regular rate and rhythm  Gastrointestinal: soft, non-tender; bowel sounds normal; no masses,  no organomegaly. inguinal hernia noted.  large, reducible, no overlying skin changes, and right  Musculoskeletal: Steady gait and movement  Skin: Cool and moist  Psychiatric: Normal affect, non-agitated, not confused       LABS:  N/a   RADS: N/a Assessment:       Non-recurrent unilateral inguinal hernia without obstruction or gangrene [K40.90]   Plan:     1. Non-recurrent unilateral inguinal hernia without obstruction or gangrene [K40.90]   Discussed how recent lab results will not affect current plan for surgical repair of hernia.  All additional questions and concerns addressed   labs/images/medications/previous chart entries reviewed personally and relevant changes/updates noted above.

## 2023-11-04 ENCOUNTER — Encounter: Admission: RE | Disposition: A | Payer: Self-pay | Source: Home / Self Care | Attending: Surgery

## 2023-11-04 ENCOUNTER — Ambulatory Visit: Admitting: Urgent Care

## 2023-11-04 ENCOUNTER — Encounter: Payer: Self-pay | Admitting: Surgery

## 2023-11-04 ENCOUNTER — Other Ambulatory Visit: Payer: Self-pay

## 2023-11-04 ENCOUNTER — Ambulatory Visit
Admission: RE | Admit: 2023-11-04 | Discharge: 2023-11-04 | Disposition: A | Discharge status: Home / Self Care | Attending: Surgery | Admitting: Surgery

## 2023-11-04 DIAGNOSIS — Z79899 Other long term (current) drug therapy: Secondary | ICD-10-CM | POA: Diagnosis not present

## 2023-11-04 DIAGNOSIS — Z7901 Long term (current) use of anticoagulants: Secondary | ICD-10-CM | POA: Diagnosis not present

## 2023-11-04 DIAGNOSIS — I480 Paroxysmal atrial fibrillation: Secondary | ICD-10-CM | POA: Insufficient documentation

## 2023-11-04 DIAGNOSIS — I341 Nonrheumatic mitral (valve) prolapse: Secondary | ICD-10-CM | POA: Diagnosis not present

## 2023-11-04 DIAGNOSIS — G3183 Dementia with Lewy bodies: Secondary | ICD-10-CM | POA: Insufficient documentation

## 2023-11-04 DIAGNOSIS — Z87891 Personal history of nicotine dependence: Secondary | ICD-10-CM | POA: Diagnosis not present

## 2023-11-04 DIAGNOSIS — I77810 Thoracic aortic ectasia: Secondary | ICD-10-CM | POA: Insufficient documentation

## 2023-11-04 DIAGNOSIS — F0280 Dementia in other diseases classified elsewhere without behavioral disturbance: Secondary | ICD-10-CM | POA: Insufficient documentation

## 2023-11-04 DIAGNOSIS — K4090 Unilateral inguinal hernia, without obstruction or gangrene, not specified as recurrent: Secondary | ICD-10-CM | POA: Insufficient documentation

## 2023-11-04 DIAGNOSIS — Z8673 Personal history of transient ischemic attack (TIA), and cerebral infarction without residual deficits: Secondary | ICD-10-CM | POA: Insufficient documentation

## 2023-11-04 HISTORY — DX: Dyspnea, unspecified: R06.00

## 2023-11-04 HISTORY — DX: Thoracic aortic ectasia: I77.810

## 2023-11-04 HISTORY — DX: Other cerebrovascular disease: I67.89

## 2023-11-04 HISTORY — PX: XI ROBOTIC ASSISTED INGUINAL HERNIA REPAIR WITH MESH: SHX6706

## 2023-11-04 HISTORY — DX: Left anterior fascicular block: I44.4

## 2023-11-04 HISTORY — DX: Localized edema: R60.0

## 2023-11-04 HISTORY — DX: Neurocognitive disorder with Lewy bodies: G31.83

## 2023-11-04 SURGERY — REPAIR, HERNIA, INGUINAL, ROBOT-ASSISTED, LAPAROSCOPIC, USING MESH
Anesthesia: General | Site: Abdomen | Laterality: Right

## 2023-11-04 MED ORDER — DOCUSATE SODIUM 100 MG PO CAPS
100.0000 mg | ORAL_CAPSULE | Freq: Two times a day (BID) | ORAL | 0 refills | Status: AC | PRN
  Filled 2023-11-04: qty 20, 10d supply, fill #0

## 2023-11-04 MED ORDER — OXYCODONE HCL 5 MG PO TABS
ORAL_TABLET | ORAL | Status: AC
  Filled 2023-11-04: qty 1

## 2023-11-04 MED ORDER — CHLORHEXIDINE GLUCONATE 0.12 % MT SOLN
15.0000 mL | Freq: Once | OROMUCOSAL | Status: AC
  Administered 2023-11-04: 15 mL via OROMUCOSAL

## 2023-11-04 MED ORDER — ROCURONIUM BROMIDE 10 MG/ML (PF) SYRINGE
PREFILLED_SYRINGE | INTRAVENOUS | Status: AC
  Filled 2023-11-04: qty 10

## 2023-11-04 MED ORDER — ROCURONIUM BROMIDE 100 MG/10ML IV SOLN
INTRAVENOUS | Status: DC | PRN
  Administered 2023-11-04: 40 mg via INTRAVENOUS
  Administered 2023-11-04: 20 mg via INTRAVENOUS
  Administered 2023-11-04: 10 mg via INTRAVENOUS
  Administered 2023-11-04: 20 mg via INTRAVENOUS

## 2023-11-04 MED ORDER — BUPIVACAINE-EPINEPHRINE 0.5% -1:200000 IJ SOLN
INTRAMUSCULAR | Status: DC | PRN
  Administered 2023-11-04: 30 mL

## 2023-11-04 MED ORDER — FENTANYL CITRATE (PF) 100 MCG/2ML IJ SOLN
INTRAMUSCULAR | Status: AC
  Filled 2023-11-04: qty 2

## 2023-11-04 MED ORDER — DEXMEDETOMIDINE HCL IN NACL 80 MCG/20ML IV SOLN
INTRAVENOUS | Status: DC | PRN
Start: 2023-11-04 — End: 2023-11-04
  Administered 2023-11-04: 8 ug via INTRAVENOUS
  Administered 2023-11-04: 4 ug via INTRAVENOUS

## 2023-11-04 MED ORDER — GABAPENTIN 300 MG PO CAPS
ORAL_CAPSULE | ORAL | Status: AC
  Filled 2023-11-04: qty 1

## 2023-11-04 MED ORDER — ONDANSETRON HCL 4 MG/2ML IJ SOLN
INTRAMUSCULAR | Status: DC | PRN
  Administered 2023-11-04: 4 mg via INTRAVENOUS

## 2023-11-04 MED ORDER — CELECOXIB 200 MG PO CAPS
ORAL_CAPSULE | ORAL | Status: AC
  Filled 2023-11-04: qty 1

## 2023-11-04 MED ORDER — ACETAMINOPHEN 500 MG PO TABS
ORAL_TABLET | ORAL | Status: AC
  Filled 2023-11-04: qty 2

## 2023-11-04 MED ORDER — OXYCODONE HCL 5 MG PO TABS
5.0000 mg | ORAL_TABLET | Freq: Once | ORAL | Status: AC | PRN
  Administered 2023-11-04: 5 mg via ORAL

## 2023-11-04 MED ORDER — SUGAMMADEX SODIUM 200 MG/2ML IV SOLN
INTRAVENOUS | Status: DC | PRN
  Administered 2023-11-04: 50 mg via INTRAVENOUS
  Administered 2023-11-04: 150 mg via INTRAVENOUS

## 2023-11-04 MED ORDER — ACETAMINOPHEN 325 MG PO TABS
650.0000 mg | ORAL_TABLET | Freq: Three times a day (TID) | ORAL | 0 refills | Status: AC | PRN
Start: 2023-11-04 — End: 2023-12-04
  Filled 2023-11-04: qty 40, 7d supply, fill #0

## 2023-11-04 MED ORDER — LACTATED RINGERS IV SOLN: INTRAVENOUS | Status: DC

## 2023-11-04 MED ORDER — PROPOFOL 10 MG/ML IV BOLUS
INTRAVENOUS | Status: AC
  Filled 2023-11-04: qty 20

## 2023-11-04 MED ORDER — LACTATED RINGERS IV SOLN: INTRAVENOUS | Status: DC | PRN

## 2023-11-04 MED ORDER — PROPOFOL 10 MG/ML IV BOLUS
INTRAVENOUS | Status: DC | PRN
  Administered 2023-11-04: 130 mg via INTRAVENOUS
  Administered 2023-11-04: 70 mg via INTRAVENOUS

## 2023-11-04 MED ORDER — CHLORHEXIDINE GLUCONATE 0.12 % MT SOLN
OROMUCOSAL | Status: AC
Start: 2023-11-04 — End: 2023-11-04
  Filled 2023-11-04: qty 15

## 2023-11-04 MED ORDER — TRAMADOL HCL 50 MG PO TABS
50.0000 mg | ORAL_TABLET | Freq: Three times a day (TID) | ORAL | 0 refills | Status: AC | PRN
  Filled 2023-11-04: qty 6, 2d supply, fill #0

## 2023-11-04 MED ORDER — OXYCODONE HCL 5 MG/5ML PO SOLN: 5.0000 mg | Freq: Once | ORAL | Status: AC | PRN

## 2023-11-04 MED ORDER — DEXAMETHASONE SOD PHOSPHATE PF 10 MG/ML IJ SOLN
INTRAMUSCULAR | Status: DC | PRN
  Administered 2023-11-04: 10 mg via INTRAVENOUS

## 2023-11-04 MED ORDER — CEFAZOLIN SODIUM-DEXTROSE 2-4 GM/100ML-% IV SOLN
2.0000 g | INTRAVENOUS | Status: AC
  Administered 2023-11-04: 2 g via INTRAVENOUS

## 2023-11-04 MED ORDER — CELECOXIB 200 MG PO CAPS
200.0000 mg | ORAL_CAPSULE | ORAL | Status: AC
  Administered 2023-11-04: 200 mg via ORAL

## 2023-11-04 MED ORDER — CHLORHEXIDINE GLUCONATE CLOTH 2 % EX PADS
6.0000 | MEDICATED_PAD | Freq: Once | CUTANEOUS | Status: AC
  Administered 2023-11-04: 6 via TOPICAL

## 2023-11-04 MED ORDER — LIDOCAINE HCL (CARDIAC) PF 100 MG/5ML IV SOSY
PREFILLED_SYRINGE | INTRAVENOUS | Status: DC | PRN
  Administered 2023-11-04: 50 mg via INTRAVENOUS

## 2023-11-04 MED ORDER — FENTANYL CITRATE (PF) 100 MCG/2ML IJ SOLN
INTRAMUSCULAR | Status: DC | PRN
  Administered 2023-11-04 (×3): 50 ug via INTRAVENOUS

## 2023-11-04 MED ORDER — ORAL CARE MOUTH RINSE: 15.0000 mL | Freq: Once | OROMUCOSAL | Status: AC

## 2023-11-04 MED ORDER — GABAPENTIN 300 MG PO CAPS
300.0000 mg | ORAL_CAPSULE | ORAL | Status: AC
  Administered 2023-11-04: 300 mg via ORAL

## 2023-11-04 MED ORDER — 0.9 % SODIUM CHLORIDE (POUR BTL) OPTIME
TOPICAL | Status: DC | PRN
  Administered 2023-11-04: 500 mL

## 2023-11-04 MED ORDER — ONDANSETRON HCL 4 MG/2ML IJ SOLN
INTRAMUSCULAR | Status: AC
  Filled 2023-11-04: qty 2

## 2023-11-04 MED ORDER — ACETAMINOPHEN 500 MG PO TABS
1000.0000 mg | ORAL_TABLET | ORAL | Status: AC
  Administered 2023-11-04: 1000 mg via ORAL

## 2023-11-04 MED ORDER — FENTANYL CITRATE (PF) 100 MCG/2ML IJ SOLN: 25.0000 ug | INTRAMUSCULAR | Status: DC | PRN

## 2023-11-04 MED ORDER — CEFAZOLIN SODIUM-DEXTROSE 2-4 GM/100ML-% IV SOLN
INTRAVENOUS | Status: AC
  Filled 2023-11-04: qty 100

## 2023-11-04 MED ORDER — BUPIVACAINE-EPINEPHRINE (PF) 0.5% -1:200000 IJ SOLN
INTRAMUSCULAR | Status: AC
Start: 2023-11-04 — End: 2023-11-04
  Filled 2023-11-04: qty 30

## 2023-11-04 SURGICAL SUPPLY — 35 items
BAG PRESSURE INF REUSE 1000 (BAG) IMPLANT
BNDG GAUZE DERMACEA FLUFF 4 (GAUZE/BANDAGES/DRESSINGS) ×1 IMPLANT
COVER TIP SHEARS 8 DVNC (MISCELLANEOUS) ×1 IMPLANT
COVER WAND RF STERILE (DRAPES) ×1 IMPLANT
DEFOGGER SCOPE WARM SEASHARP (MISCELLANEOUS) ×1 IMPLANT
DERMABOND ADVANCED .7 DNX12 (GAUZE/BANDAGES/DRESSINGS) ×1 IMPLANT
DRAPE ARM DVNC X/XI (DISPOSABLE) ×3 IMPLANT
DRAPE COLUMN DVNC XI (DISPOSABLE) ×1 IMPLANT
ELECTRODE REM PT RTRN 9FT ADLT (ELECTROSURGICAL) ×1 IMPLANT
FORCEPS BPLR FENES DVNC XI (FORCEP) ×1 IMPLANT
FORCEPS PROGRASP DVNC XI (FORCEP) IMPLANT
GLOVE BIOGEL PI IND STRL 7.0 (GLOVE) ×2 IMPLANT
GLOVE SURG SYN 6.5 PF PI (GLOVE) ×4 IMPLANT
GOWN STRL REUS W/ TWL LRG LVL3 (GOWN DISPOSABLE) ×4 IMPLANT
IRRIGATOR SUCT 8 DISP DVNC XI (IRRIGATION / IRRIGATOR) IMPLANT
IV 0.9% NACL 1000 ML (IV SOLUTION) IMPLANT
MANIFOLD NEPTUNE II (INSTRUMENTS) ×1 IMPLANT
MESH 3DMAX MID 4X6 RT LRG (Mesh General) IMPLANT
NDL DRIVE SUT CUT DVNC (INSTRUMENTS) ×1 IMPLANT
NDL HYPO 22X1.5 SAFETY MO (MISCELLANEOUS) ×1 IMPLANT
NDL INSUFFLATION 14GA 120MM (NEEDLE) ×1 IMPLANT
NEEDLE DRIVE SUT CUT DVNC (INSTRUMENTS) ×1 IMPLANT
NEEDLE HYPO 22X1.5 SAFETY MO (MISCELLANEOUS) ×1 IMPLANT
NEEDLE INSUFFLATION 14GA 120MM (NEEDLE) ×1 IMPLANT
OBTURATOR OPTICALSTD 8 DVNC (TROCAR) ×1 IMPLANT
PACK LAP CHOLECYSTECTOMY (MISCELLANEOUS) ×1 IMPLANT
SCISSORS MNPLR CVD DVNC XI (INSTRUMENTS) ×1 IMPLANT
SEAL UNIV 5-12 XI (MISCELLANEOUS) ×3 IMPLANT
SET TUBE SMOKE EVAC HIGH FLOW (TUBING) ×1 IMPLANT
SOLUTION ELECTROSURG ANTI STCK (MISCELLANEOUS) ×1 IMPLANT
SUT STRATA 3-0 15 RB-1.5 (SUTURE) ×1 IMPLANT
SUT VIC AB 2-0 SH 27XBRD (SUTURE) ×1 IMPLANT
SUTURE MNCRL 4-0 27XMF (SUTURE) ×1 IMPLANT
SYR 30ML LL (SYRINGE) ×1 IMPLANT
TAPE TRANSPORE STRL 2 31045 (GAUZE/BANDAGES/DRESSINGS) IMPLANT

## 2023-11-04 NOTE — Anesthesia Procedure Notes (Signed)
 Procedure Name: Intubation Date/Time: 11/04/2023 10:11 AM  Performed by: Lorrene Camelia LABOR, CRNAPre-anesthesia Checklist: Patient identified, Patient being monitored, Timeout performed, Emergency Drugs available and Suction available Patient Re-evaluated:Patient Re-evaluated prior to induction Oxygen Delivery Method: Circle system utilized Preoxygenation: Pre-oxygenation with 100% oxygen Induction Type: IV induction Ventilation: Mask ventilation without difficulty Laryngoscope Size: Mac and 3 Grade View: Grade I Tube type: Oral Tube size: 7.5 mm Number of attempts: 1 Airway Equipment and Method: Stylet Placement Confirmation: ETT inserted through vocal cords under direct vision, positive ETCO2 and breath sounds checked- equal and bilateral Secured at: 23 cm Tube secured with: Tape Dental Injury: Injury to lip  Comments: Small knick on upper lip

## 2023-11-04 NOTE — Op Note (Signed)
 Preoperative diagnosis: Right, initial, reducible inguinal Hernia.  Postoperative diagnosis: Same  Procedure: Robotic assisted laparoscopic right inguinal hernia repair with mesh  Anesthesia: General  Surgeon: Dr. Tye  Wound Classification: Clean  Specimen: none  Complications: None  Estimated Blood Loss: 10mL   Indications:  inguinal hernia. Repair was indicated to avoid complications of incarceration, obstruction and pain, and a prosthetic mesh repair was elected.  See H&P for further details.  Findings: 1. Vas Deferens and cord structures identified and preserved 2. Bard 3D max medium weight mesh used for repair 3. Adequate hemostasis achieved  Description of procedure: The patient was taken to the operating room. A time-out was completed verifying correct patient, procedure, site, positioning, and implant(s) and/or special equipment prior to beginning this procedure.  Area was prepped and draped in the usual sterile fashion. An incision was marked 20 cm above the pubic tubercle, slightly above the umbilicus  Scrotum wrapped in Kerlix roll.  Veress needle inserted at palmer's point.  Saline drop test noted to be positive with gradual increase in pressure after initiation of gas insufflation.  15 mm of pressure was achieved prior to removing the Veress needle and then placing a 8 mm port via the Optiview technique through the supraumbilical site.  Inspection of the area afterwards noted no injury to the surrounding organs during insertion of the needle and the port.  2 port sites were marked 8 cm to the lateral sides of the initial port, and a 8 mm robotic port was placed on the left side, another 8 mm robotic port on the right side under direct supervision.  Local anesthesia  infused to the preplanned incision sites prior to insertion of the port.  The [employer] platform was then brought into the operative field and docked to the ports.  Examination of the abdominal cavity noted  a right inguinal hernia.  A peritoneal flap was created approximately 8cm cephalad to the defect by using scissors with electrocautery.  Dissection was carried down towards the pubic tubercle, developing the myopectineal orifice view.  Laterally the flap was carried towards the ASIS.  Very large hernia lipoma and large hernia sac was noted, which carefully dissected away from the adjacent tissues to be fully reduced out of hernia cavity.  Any bleeding was controlled with combination of electrocautery and manual pressure.    After confirming adequate dissection and the peritoneal reflection completely down and away from the cord structures, a Large Bard 3DMax medium weight mesh was placed within the anterior abdominal wall, secured in place using 2-0 Vicryl on an SH needle immediately above the pubic tubercle.  After noting proper placement of the mesh with the peritoneal reflection deep to it, the previously created peritoneal flap was secured back up to the anterior abdominal wall using running 3-0 V-Lock.  Both needles were then removed out of the abdominal cavity, Xi platform undocked from the ports and removed off of operative field.  Marcaine infused as ilioinguinal block.  Abdomen then desufflated and ports removed. All the skin incisions were then closed with a subcuticular stitch of Monocryl 4-0. Dermabond was applied. The testis was gently pulled down into its anatomic position in the scrotum.  The patient tolerated the procedure well and was taken to the postanesthesia care unit in stable condition. Sponge and instrument count correct at end of procedure.

## 2023-11-04 NOTE — Interval H&P Note (Signed)
 History and Physical Interval Note:  11/04/2023 9:45 AM  Jacob Salas Hines  has presented today for surgery, with the diagnosis of K40.90 Right non recurrent unilateral inguinal hernia without obstruction or gangrene.  The various methods of treatment have been discussed with the patient and family. After consideration of risks, benefits and other options for treatment, the patient has consented to  Procedure(s) with comments: REPAIR, HERNIA, INGUINAL, ROBOT-ASSISTED, LAPAROSCOPIC, USING MESH (Right) - Possible left if noted during procedure, also a possibility of converting to open if to big too repair laparascopically. as a surgical intervention.  The patient's history has been reviewed, patient examined, no change in status, stable for surgery.  I have reviewed the patient's chart and labs.  Questions were answered to the patient's satisfaction.     Kaitrin Seybold Tye

## 2023-11-04 NOTE — Transfer of Care (Signed)
 Immediate Anesthesia Transfer of Care Note  Patient: Jacob Salas  Procedure(s) Performed: REPAIR, HERNIA, INGUINAL, ROBOT-ASSISTED, LAPAROSCOPIC, USING MESH (Right: Abdomen)  Patient Location: PACU  Anesthesia Type:General  Level of Consciousness: awake and patient cooperative  Airway & Oxygen Therapy: Patient Spontanous Breathing and Patient connected to nasal cannula oxygen  Post-op Assessment: Report given to RN and Post -op Vital signs reviewed and stable  Post vital signs: Reviewed and stable  Last Vitals:  Vitals Value Taken Time  BP 170/114 11/04/23 12:06  Temp 36 C 11/04/23 12:07  Pulse 57 11/04/23 12:08  Resp 16 11/04/23 12:08  SpO2 100 % 11/04/23 12:08  Vitals shown include unfiled device data.  Last Pain:  Vitals:   11/04/23 1207  TempSrc:   PainSc: 0-No pain         Complications: No notable events documented.

## 2023-11-04 NOTE — Discharge Instructions (Signed)
 Hernia repair, Care After This sheet gives you information about how to care for yourself after your procedure. Your health care provider may also give you more specific instructions. If you have problems or questions, contact your health care provider. What can I expect after the procedure? After your procedure, it is common to have the following: Pain in your abdomen, especially in the incision areas. You will be given medicine to control the pain. Tiredness. This is a normal part of the recovery process. Your energy level will return to normal over the next several weeks. Changes in your bowel movements, such as constipation or needing to go more often. Talk with your health care provider about how to manage this. Follow these instructions at home: Medicines  tylenol  as needed for discomfort.     Use narcotics, if prescribed, only when tylenol  is not enough to control pain.  325-650mg  every 8hrs to max of 3000mg /24hrs (including the 325mg  in every norco dose) for the tylenol .   Resume Eliquis in 48 hours PLEASE RECORD NUMBER OF PILLS TAKEN UNTIL NEXT FOLLOW UP APPT.  THIS WILL HELP DETERMINE HOW READY YOU ARE TO BE RELEASED FROM ANY ACTIVITY RESTRICTIONS Do not drive or use heavy machinery while taking prescription pain medicine. Do not drink alcohol while taking prescription pain medicine.  Incision care    Follow instructions from your health care provider about how to take care of your incision areas. Make sure you: Keep your incisions clean and dry. Wash your hands with soap and water before and after applying medicine to the areas, and before and after changing your bandage (dressing). If soap and water are not available, use hand sanitizer. Change your dressing as told by your health care provider. Leave stitches (sutures), skin glue, or adhesive strips in place. These skin closures may need to stay in place for 2 weeks or longer. If adhesive strip edges start to loosen and curl up,  you may trim the loose edges. Do not remove adhesive strips completely unless your health care provider tells you to do that. Do not wear tight clothing over the incisions. Tight clothing may rub and irritate the incision areas, which may cause the incisions to open. Do not take baths, swim, or use a hot tub until your health care provider approves. OK TO SHOWER IN 24HRS.   Check your incision area every day for signs of infection. Check for: More redness, swelling, or pain. More fluid or blood. Warmth. Pus or a bad smell. Activity Avoid lifting anything that is heavier than 10 lb (4.5 kg) for 2 weeks or until your health care provider says it is okay. No pushing/pulling greater than 30lbs You may resume normal activities as told by your health care provider. Ask your health care provider what activities are safe for you. Take rest breaks during the day as needed. Eating and drinking Follow instructions from your health care provider about what you can eat after surgery. To prevent or treat constipation while you are taking prescription pain medicine, your health care provider may recommend that you: Drink enough fluid to keep your urine clear or pale yellow. Take over-the-counter or prescription medicines. Eat foods that are high in fiber, such as fresh fruits and vegetables, whole grains, and beans. Limit foods that are high in fat and processed sugars, such as fried and sweet foods. General instructions Ask your health care provider when you will need an appointment to get your sutures or staples removed. Keep all follow-up visits  as told by your health care provider. This is important. Contact a health care provider if: You have more redness, swelling, or pain around your incisions. You have more fluid or blood coming from the incisions. Your incisions feel warm to the touch. You have pus or a bad smell coming from your incisions or your dressing. You have a fever. You have an  incision that breaks open (edges not staying together) after sutures or staples have been removed. You develop a rash. You have chest pain or difficulty breathing. You have pain or swelling in your legs. You feel light-headed or you faint. Your abdomen swells (becomes distended). You have nausea or vomiting. You have blood in your stool (feces). This information is not intended to replace advice given to you by your health care provider. Make sure you discuss any questions you have with your health care provider. Document Released: 07/18/2004 Document Revised: 09/17/2017 Document Reviewed: 09/30/2015 Elsevier Interactive Patient Education  2019 [employer].

## 2023-11-04 NOTE — Anesthesia Postprocedure Evaluation (Signed)
 Anesthesia Post Note  Patient: Jacob Salas  Procedure(s) Performed: REPAIR, HERNIA, INGUINAL, ROBOT-ASSISTED, LAPAROSCOPIC, USING MESH (Right: Abdomen)  Patient location during evaluation: PACU Anesthesia Type: General Level of consciousness: awake and alert Pain management: pain level controlled Vital Signs Assessment: post-procedure vital signs reviewed and stable Respiratory status: spontaneous breathing, nonlabored ventilation, respiratory function stable and patient connected to nasal cannula oxygen Cardiovascular status: blood pressure returned to baseline and stable Postop Assessment: no apparent nausea or vomiting Anesthetic complications: no   No notable events documented.   Last Vitals:  Vitals:   11/04/23 1245 11/04/23 1256  BP: (!) 155/92   Pulse: 63 72  Resp: 14 17  Temp:    SpO2: 100% 100%    Last Pain:  Vitals:   11/04/23 1256  TempSrc:   PainSc: 4                  Lendia LITTIE Mae

## 2023-11-04 NOTE — Anesthesia Preprocedure Evaluation (Addendum)
 Anesthesia Evaluation  Patient identified by MRN, date of birth, ID band Patient awake    Reviewed: Allergy & Precautions, NPO status , Patient's Chart, lab work & pertinent test results  History of Anesthesia Complications Negative for: history of anesthetic complications  Airway Mallampati: III  TM Distance: >3 FB Neck ROM: full    Dental no notable dental hx.    Pulmonary former smoker   Pulmonary exam normal        Cardiovascular Normal cardiovascular exam+ dysrhythmias Atrial Fibrillation + Valvular Problems/Murmurs MVP      Neuro/Psych  PSYCHIATRIC DISORDERS Anxiety Depression   Dementia CVA    GI/Hepatic negative GI ROS, Neg liver ROS,,,  Endo/Other  negative endocrine ROS    Renal/GU      Musculoskeletal  (+) Arthritis ,    Abdominal   Peds  Hematology  (+) Blood dyscrasia, anemia , REFUSES BLOOD PRODUCTS, JEHOVAH'S WITNESS  Anesthesia Other Findings Past Medical History: No date: Anemia No date: Anticoagulated on apixaban No date: Anxiety No date: Aortic root dilatation No date: Basal cell carcinoma of skin No date: BPH (benign prostatic hyperplasia) No date: Cerebral microvascular disease No date: Depression No date: Dyspnea 2022: H/O bilateral cataract extraction 12/12/2020: History of 2019 novel coronavirus disease (COVID-19) No date: LAFB (left anterior fascicular block) No date: Moderate Lewy body dementia (HCC)     Comment:  a.) on acetylcholinesterase inhibitor (donepazil) No date: MVP (mitral valve prolapse) No date: Osteoarthritis 12/2021: PAF (paroxysmal atrial fibrillation) (HCC)     Comment:  a.) noted on preop ECG 12/2021 (newly Dx'd); b.)               CHA2DS2VASc = 4 (age x 2, CVA x 2) as of 11/01/2023; c.)               s/p DCCV 04/15/2023 --> 200 J x 1 --> NSR; d.)               rate/rhythm maintained on oral metoprolol tartrate;               chronically anticoagulated with  apixaban No date: Peripheral edema No date: Raynaud's disease without gangrene No date: Refusal of blood transfusions as patient is Jehovah's Witness No date: Right inguinal hernia 10/01/2018: Right thalamic stroke [facility])     Comment:  a.) MRI brain 10/01/2018 : cerebral white matter signal               changes with solitary chronic microhemorrhage in the               right thalamus; followed by neurology as an outpatient No date: Shuffling gait No date: Squamous cell skin cancer  Past Surgical History: 04/15/2023: CARDIOVERSION; N/A     Comment:  Procedure: CARDIOVERSION;  Surgeon: Florencio Cara BIRCH,               MD;  Location: [facility] ORS;  Service: Cardiovascular;                Laterality: N/A; 01/31/2020: CATARACT EXTRACTION W/PHACO; Right     Comment:  Procedure: CATARACT EXTRACTION PHACO AND INTRAOCULAR               LENS PLACEMENT (IOC) MALYUGIN RIGHT 10.59 01:12.9 14.5%;               Surgeon: Mittie Gaskin, MD;  Location: [facility];  Service: Ophthalmology;  Laterality:               Right; 02/14/2020: CATARACT EXTRACTION W/PHACO; Left     Comment:  Procedure: CATARACT EXTRACTION PHACO AND INTRAOCULAR               LENS PLACEMENT (IOC) LEFT 7.83 01:04.5 12.1%;  Surgeon:               Mittie Gaskin, MD;  Location: [facility];              Service: Ophthalmology;  Laterality: Left; No date: COLONOSCOPY No date: TONSILLECTOMY     Reproductive/Obstetrics negative OB ROS                              Anesthesia Physical Anesthesia Plan  ASA: 3  Anesthesia Plan: General ETT   Post-op Pain Management: Tylenol  PO (pre-op)*, Gabapentin PO (pre-op)* and Celebrex PO (pre-op)*   Induction: Intravenous  PONV Risk Score and Plan: 2 and Ondansetron, Dexamethasone and Treatment may vary due to age or medical condition  Airway Management Planned: Oral ETT  Additional Equipment:   Intra-op Plan:    Post-operative Plan: Extubation in OR  Informed Consent: I have reviewed the patients History and Physical, chart, labs and discussed the procedure including the risks, benefits and alternatives for the proposed anesthesia with the patient or authorized representative who has indicated his/her understanding and acceptance.     Dental Advisory Given  Plan Discussed with: Anesthesiologist, CRNA and Surgeon  Anesthesia Plan Comments: (Patient consented for risks of anesthesia including but not limited to:  - adverse reactions to medications - damage to eyes, teeth, lips or other oral mucosa - nerve damage due to positioning  - sore throat or hoarseness - Damage to heart, brain, nerves, lungs, other parts of body or loss of life  Patient voiced understanding and assent.)         Anesthesia Quick Evaluation

## 2023-11-05 ENCOUNTER — Encounter: Payer: Self-pay | Admitting: Surgery

## 2023-12-30 ENCOUNTER — Other Ambulatory Visit: Payer: Self-pay
